# Patient Record
Sex: Male | Born: 1940 | Race: White | Hispanic: No | Marital: Married | Smoking: Former smoker
Health system: Southern US, Community
[De-identification: ages and names within clinical notes are randomized; demographics above are authoritative.]

## PROBLEM LIST (undated history)

## (undated) DIAGNOSIS — Z8739 Personal history of other diseases of the musculoskeletal system and connective tissue: Secondary | ICD-10-CM

## (undated) DIAGNOSIS — I10 Essential (primary) hypertension: Secondary | ICD-10-CM

## (undated) DIAGNOSIS — I252 Old myocardial infarction: Secondary | ICD-10-CM

## (undated) DIAGNOSIS — R0600 Dyspnea, unspecified: Secondary | ICD-10-CM

## (undated) DIAGNOSIS — N189 Chronic kidney disease, unspecified: Secondary | ICD-10-CM

## (undated) DIAGNOSIS — R0609 Other forms of dyspnea: Secondary | ICD-10-CM

## (undated) DIAGNOSIS — I493 Ventricular premature depolarization: Secondary | ICD-10-CM

## (undated) DIAGNOSIS — N529 Male erectile dysfunction, unspecified: Secondary | ICD-10-CM

## (undated) DIAGNOSIS — K219 Gastro-esophageal reflux disease without esophagitis: Secondary | ICD-10-CM

## (undated) DIAGNOSIS — K635 Polyp of colon: Secondary | ICD-10-CM

## (undated) DIAGNOSIS — I209 Angina pectoris, unspecified: Secondary | ICD-10-CM

## (undated) DIAGNOSIS — I2510 Atherosclerotic heart disease of native coronary artery without angina pectoris: Secondary | ICD-10-CM

## (undated) DIAGNOSIS — Z9289 Personal history of other medical treatment: Secondary | ICD-10-CM

## (undated) DIAGNOSIS — E785 Hyperlipidemia, unspecified: Secondary | ICD-10-CM

## (undated) DIAGNOSIS — I509 Heart failure, unspecified: Secondary | ICD-10-CM

## (undated) DIAGNOSIS — M1990 Unspecified osteoarthritis, unspecified site: Secondary | ICD-10-CM

## (undated) DIAGNOSIS — K449 Diaphragmatic hernia without obstruction or gangrene: Secondary | ICD-10-CM

## (undated) HISTORY — DX: Male erectile dysfunction, unspecified: N52.9

## (undated) HISTORY — DX: Old myocardial infarction: I25.2

## (undated) HISTORY — DX: Gastro-esophageal reflux disease without esophagitis: K21.9

## (undated) HISTORY — DX: Polyp of colon: K63.5

## (undated) HISTORY — DX: Hyperlipidemia, unspecified: E78.5

## (undated) HISTORY — PX: HEMORRHOIDECTOMY WITH HEMORRHOID BANDING: SHX5633

## (undated) HISTORY — DX: Atherosclerotic heart disease of native coronary artery without angina pectoris: I25.10

## (undated) HISTORY — DX: Essential (primary) hypertension: I10

## (undated) HISTORY — DX: Diaphragmatic hernia without obstruction or gangrene: K44.9

## (undated) HISTORY — DX: Ventricular premature depolarization: I49.3

## (undated) HISTORY — PX: COLONOSCOPY: SHX174

## (undated) HISTORY — PX: CARDIAC CATHETERIZATION: SHX172

---
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
## 1958-06-26 HISTORY — PX: TONSILLECTOMY AND ADENOIDECTOMY: SUR1326

## 1991-06-27 DIAGNOSIS — I252 Old myocardial infarction: Secondary | ICD-10-CM

## 1991-06-27 HISTORY — PX: CORONARY ARTERY BYPASS GRAFT: SHX141

## 1991-06-27 HISTORY — DX: Old myocardial infarction: I25.2

## 1999-08-01 ENCOUNTER — Encounter: Admission: RE | Admit: 1999-08-01 | Discharge: 1999-10-30 | Payer: Self-pay | Admitting: Orthopaedic Surgery

## 1999-08-17 ENCOUNTER — Ambulatory Visit (HOSPITAL_BASED_OUTPATIENT_CLINIC_OR_DEPARTMENT_OTHER): Admission: RE | Admit: 1999-08-17 | Discharge: 1999-08-17 | Payer: Self-pay | Admitting: Orthopaedic Surgery

## 1999-08-25 HISTORY — PX: INCISION AND DRAINAGE OF WOUND: SHX1803

## 1999-08-25 HISTORY — PX: SHOULDER ARTHROSCOPY W/ ROTATOR CUFF REPAIR: SHX2400

## 1999-08-30 ENCOUNTER — Encounter: Admission: RE | Admit: 1999-08-30 | Discharge: 1999-09-22 | Payer: Self-pay | Admitting: Orthopaedic Surgery

## 2000-01-03 ENCOUNTER — Encounter: Payer: Self-pay | Admitting: Orthopaedic Surgery

## 2000-01-03 ENCOUNTER — Ambulatory Visit (HOSPITAL_COMMUNITY): Admission: RE | Admit: 2000-01-03 | Discharge: 2000-01-03 | Payer: Self-pay | Admitting: Orthopaedic Surgery

## 2000-01-04 ENCOUNTER — Encounter: Admission: RE | Admit: 2000-01-04 | Discharge: 2000-01-04 | Payer: Self-pay | Admitting: Internal Medicine

## 2000-01-06 ENCOUNTER — Encounter (INDEPENDENT_AMBULATORY_CARE_PROVIDER_SITE_OTHER): Payer: Self-pay | Admitting: Specialist

## 2000-01-06 ENCOUNTER — Inpatient Hospital Stay (HOSPITAL_COMMUNITY): Admission: EM | Admit: 2000-01-06 | Discharge: 2000-01-09 | Payer: Self-pay | Admitting: Orthopaedic Surgery

## 2000-01-06 ENCOUNTER — Ambulatory Visit (HOSPITAL_BASED_OUTPATIENT_CLINIC_OR_DEPARTMENT_OTHER): Admission: RE | Admit: 2000-01-06 | Discharge: 2000-01-06 | Payer: Self-pay | Admitting: Orthopaedic Surgery

## 2000-02-07 ENCOUNTER — Encounter: Admission: RE | Admit: 2000-02-07 | Discharge: 2000-02-07 | Payer: Self-pay | Admitting: Internal Medicine

## 2000-02-15 ENCOUNTER — Encounter: Admission: RE | Admit: 2000-02-15 | Discharge: 2000-02-29 | Payer: Self-pay | Admitting: Podiatry

## 2000-02-28 ENCOUNTER — Encounter: Admission: RE | Admit: 2000-02-28 | Discharge: 2000-02-28 | Payer: Self-pay | Admitting: Internal Medicine

## 2000-03-27 ENCOUNTER — Ambulatory Visit (HOSPITAL_COMMUNITY): Admission: RE | Admit: 2000-03-27 | Discharge: 2000-03-27 | Payer: Self-pay | Admitting: Gastroenterology

## 2000-03-27 ENCOUNTER — Encounter (INDEPENDENT_AMBULATORY_CARE_PROVIDER_SITE_OTHER): Payer: Self-pay | Admitting: Specialist

## 2001-02-24 HISTORY — PX: KNEE ARTHROSCOPY: SHX127

## 2001-03-12 ENCOUNTER — Ambulatory Visit (HOSPITAL_BASED_OUTPATIENT_CLINIC_OR_DEPARTMENT_OTHER): Admission: RE | Admit: 2001-03-12 | Discharge: 2001-03-12 | Payer: Self-pay | Admitting: Orthopaedic Surgery

## 2001-06-04 ENCOUNTER — Ambulatory Visit (HOSPITAL_COMMUNITY): Admission: RE | Admit: 2001-06-04 | Discharge: 2001-06-04 | Payer: Self-pay | Admitting: Interventional Cardiology

## 2003-07-13 ENCOUNTER — Encounter: Admission: RE | Admit: 2003-07-13 | Discharge: 2003-08-06 | Payer: Self-pay | Admitting: Podiatry

## 2004-07-27 ENCOUNTER — Encounter: Admission: RE | Admit: 2004-07-27 | Discharge: 2004-07-27 | Payer: Self-pay | Admitting: Orthopaedic Surgery

## 2004-08-08 ENCOUNTER — Encounter: Admission: RE | Admit: 2004-08-08 | Discharge: 2004-08-08 | Payer: Self-pay | Admitting: Orthopaedic Surgery

## 2004-08-15 ENCOUNTER — Encounter: Admission: RE | Admit: 2004-08-15 | Discharge: 2004-08-18 | Payer: Self-pay | Admitting: Family Medicine

## 2004-08-24 HISTORY — PX: LUMBAR DISC SURGERY: SHX700

## 2004-08-25 ENCOUNTER — Inpatient Hospital Stay (HOSPITAL_COMMUNITY): Admission: RE | Admit: 2004-08-25 | Discharge: 2004-08-26 | Payer: Self-pay | Admitting: Neurosurgery

## 2004-09-25 ENCOUNTER — Ambulatory Visit: Payer: Self-pay | Admitting: Pulmonary Disease

## 2004-09-25 ENCOUNTER — Ambulatory Visit: Payer: Self-pay | Admitting: Internal Medicine

## 2004-09-25 ENCOUNTER — Inpatient Hospital Stay (HOSPITAL_COMMUNITY): Admission: EM | Admit: 2004-09-25 | Discharge: 2004-10-08 | Payer: Self-pay | Admitting: Emergency Medicine

## 2004-09-26 ENCOUNTER — Encounter (INDEPENDENT_AMBULATORY_CARE_PROVIDER_SITE_OTHER): Payer: Self-pay | Admitting: Interventional Cardiology

## 2004-10-05 ENCOUNTER — Encounter (INDEPENDENT_AMBULATORY_CARE_PROVIDER_SITE_OTHER): Payer: Self-pay | Admitting: *Deleted

## 2005-12-22 IMAGING — CR DG FOOT COMPLETE 3+V*R*
3 series · 3 of 3 positions shown · non-contrast
Comparison: none

CLINICAL DATA: Pain in the right great toe.
 RIGHT FOOT ? 3 VIEWS - 10/05/04:
 Three views of the right foot show soft tissue fullness along the first MTP articulation.  An accessory ossicle is demonstrated medially.  There is no fracture or erosive changes.  Mild degenerative changes are seen at the first interphalangeal joint.  Negative for radiopaque foreign body or fracture.

[view not recorded (1 of 3)]
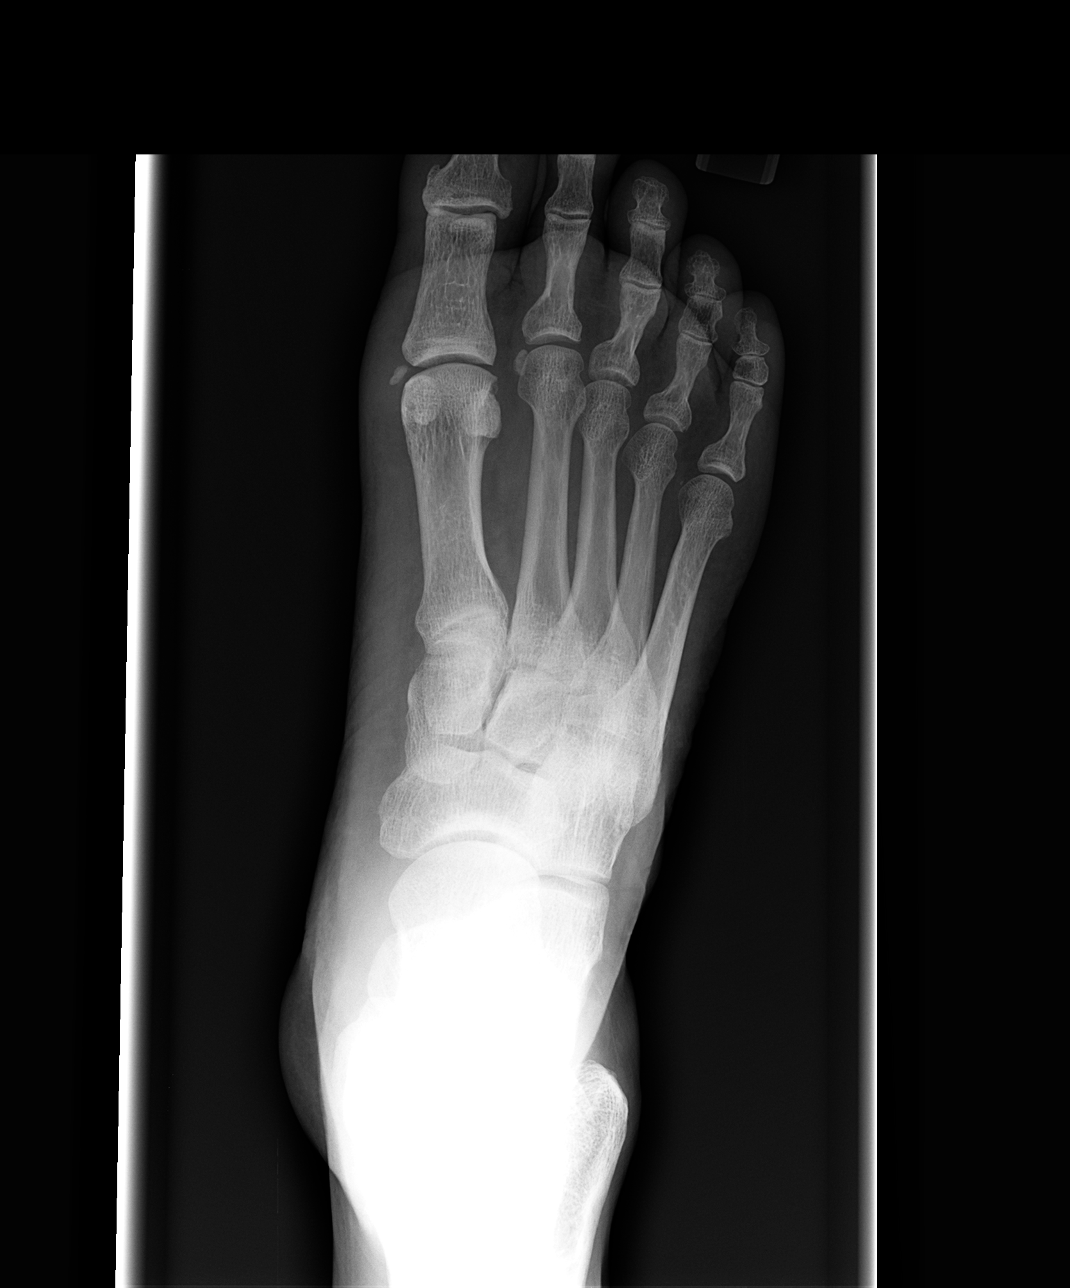

[view not recorded (2 of 3)]
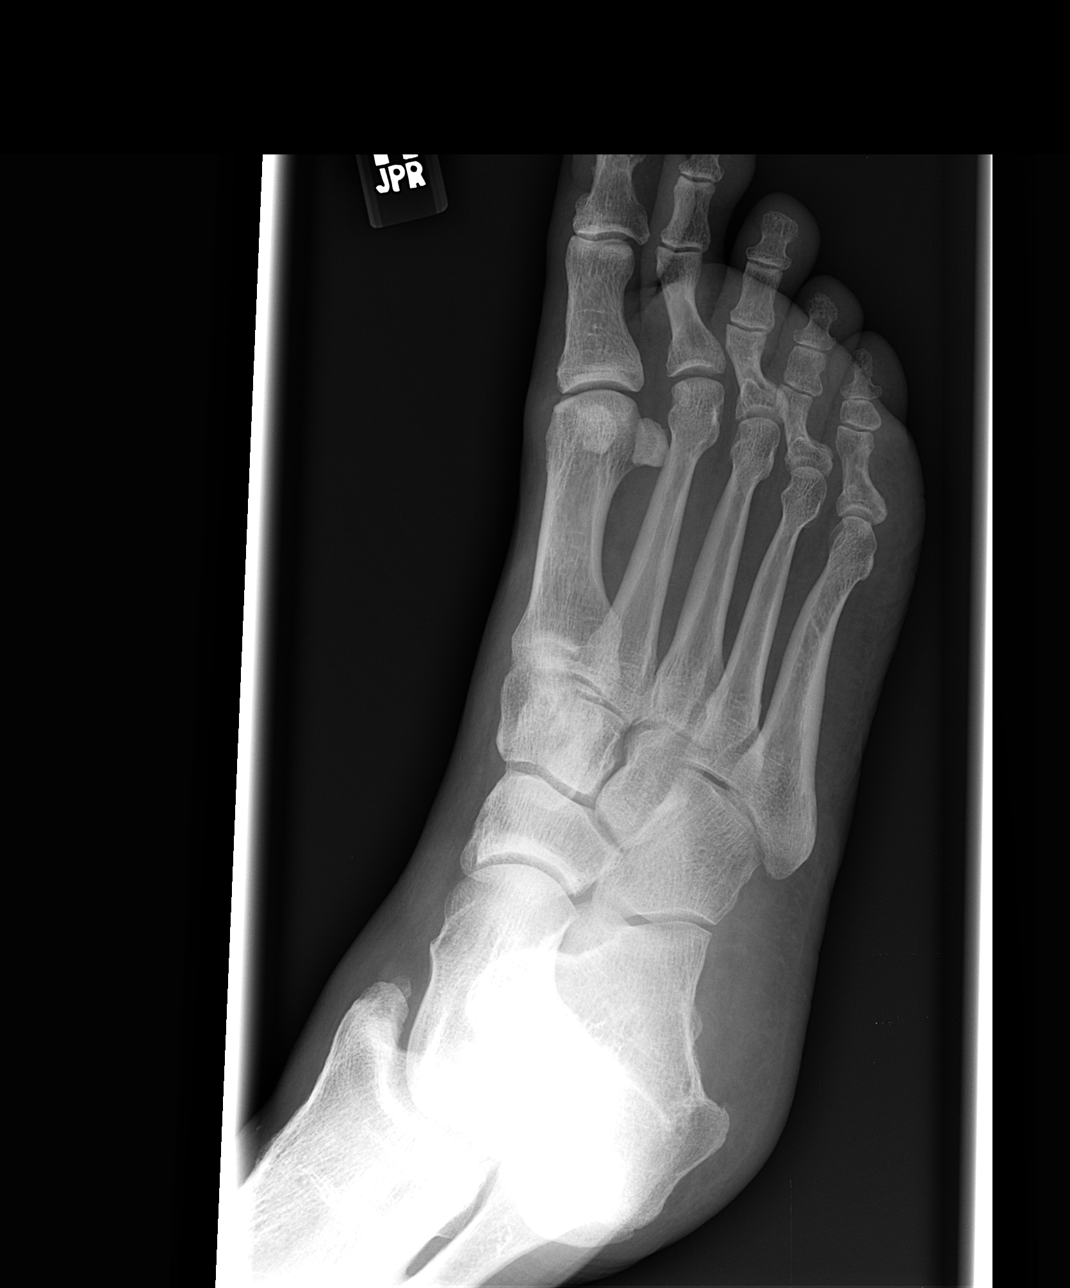

[view not recorded (3 of 3)]
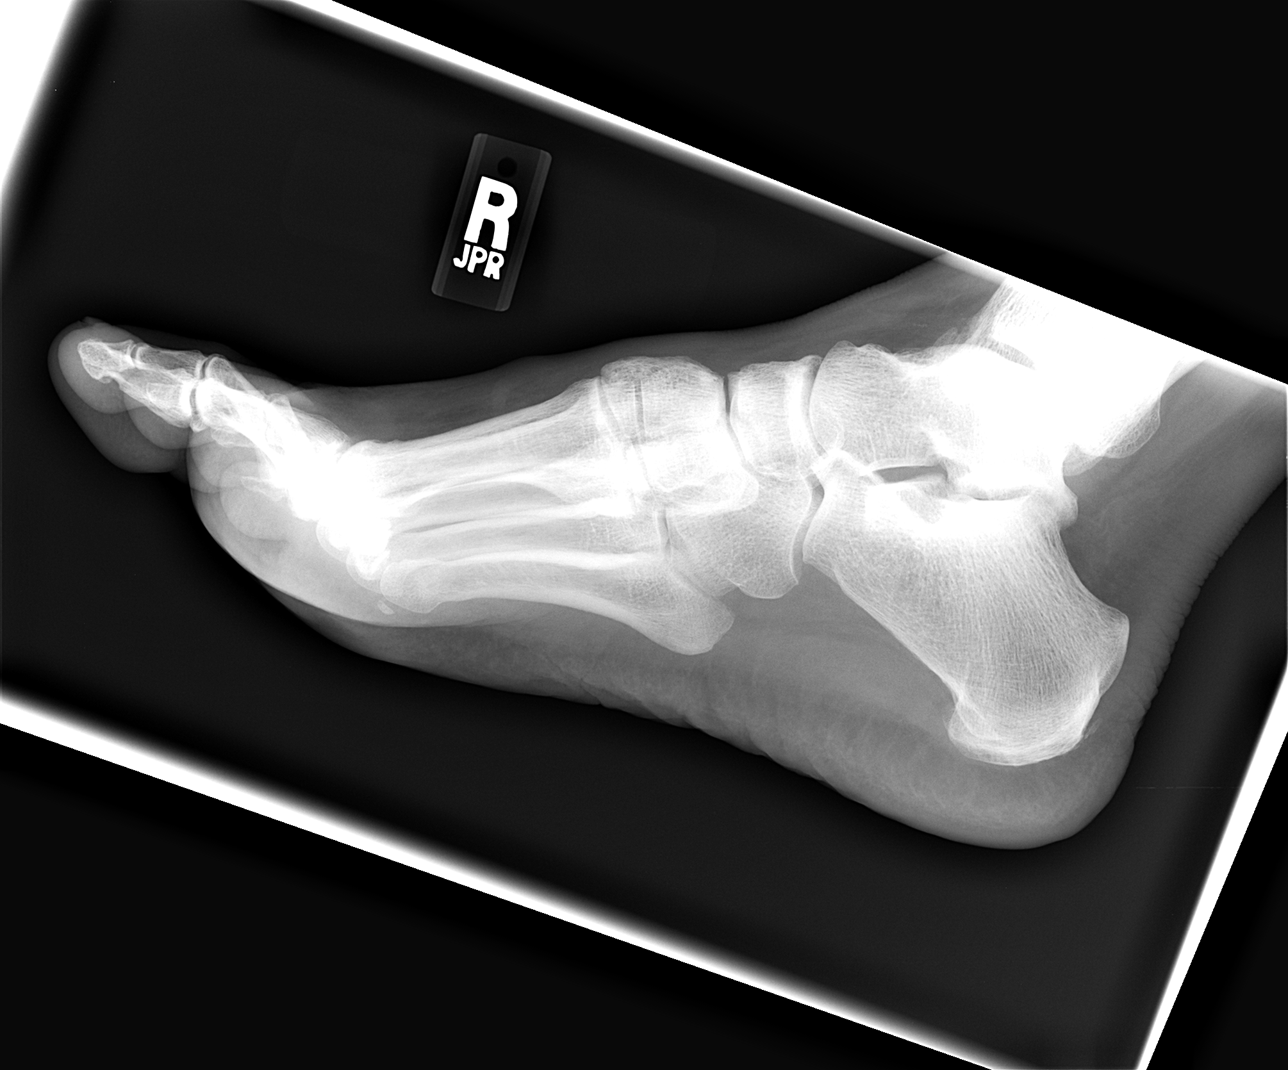

[3 of 3 positions shown; findings below may reference images not displayed]

IMPRESSION: Soft tissue fullness about the first MTP joint.  Negative for fracture or underlying erosion.

## 2007-01-25 ENCOUNTER — Encounter: Admission: RE | Admit: 2007-01-25 | Discharge: 2007-04-25 | Payer: Self-pay | Admitting: Family Medicine

## 2008-06-26 HISTORY — PX: ATRIAL FLUTTER ABLATION: SHX5733

## 2009-02-18 ENCOUNTER — Encounter: Admission: RE | Admit: 2009-02-18 | Discharge: 2009-02-18 | Payer: Self-pay | Admitting: Family Medicine

## 2009-02-18 ENCOUNTER — Encounter: Payer: Self-pay | Admitting: Internal Medicine

## 2009-02-22 ENCOUNTER — Encounter: Payer: Self-pay | Admitting: Internal Medicine

## 2009-03-02 ENCOUNTER — Encounter: Payer: Self-pay | Admitting: Internal Medicine

## 2009-03-29 ENCOUNTER — Encounter: Payer: Self-pay | Admitting: Internal Medicine

## 2009-05-03 ENCOUNTER — Ambulatory Visit: Payer: Self-pay | Admitting: Internal Medicine

## 2009-05-03 ENCOUNTER — Encounter: Payer: Self-pay | Admitting: Internal Medicine

## 2009-05-03 DIAGNOSIS — M1990 Unspecified osteoarthritis, unspecified site: Secondary | ICD-10-CM | POA: Insufficient documentation

## 2009-05-03 DIAGNOSIS — I25810 Atherosclerosis of coronary artery bypass graft(s) without angina pectoris: Secondary | ICD-10-CM | POA: Insufficient documentation

## 2009-05-03 DIAGNOSIS — N189 Chronic kidney disease, unspecified: Secondary | ICD-10-CM | POA: Insufficient documentation

## 2009-05-03 DIAGNOSIS — I10 Essential (primary) hypertension: Secondary | ICD-10-CM | POA: Insufficient documentation

## 2009-05-03 DIAGNOSIS — M109 Gout, unspecified: Secondary | ICD-10-CM | POA: Insufficient documentation

## 2009-05-03 DIAGNOSIS — I5022 Chronic systolic (congestive) heart failure: Secondary | ICD-10-CM | POA: Insufficient documentation

## 2009-05-03 DIAGNOSIS — E785 Hyperlipidemia, unspecified: Secondary | ICD-10-CM | POA: Insufficient documentation

## 2009-05-03 DIAGNOSIS — Z862 Personal history of diseases of the blood and blood-forming organs and certain disorders involving the immune mechanism: Secondary | ICD-10-CM | POA: Insufficient documentation

## 2009-05-07 ENCOUNTER — Telehealth: Payer: Self-pay | Admitting: Internal Medicine

## 2009-05-18 ENCOUNTER — Ambulatory Visit: Payer: Self-pay | Admitting: Internal Medicine

## 2009-05-18 LAB — CONVERTED CEMR LAB
BUN: 23 mg/dL (ref 6–23)
CO2: 32 meq/L (ref 19–32)
Chloride: 108 meq/L (ref 96–112)
Eosinophils Relative: 2.4 % (ref 0.0–5.0)
Glucose, Bld: 90 mg/dL (ref 70–99)
HCT: 40 % (ref 39.0–52.0)
Lymphs Abs: 2.4 10*3/uL (ref 0.7–4.0)
MCV: 98.2 fL (ref 78.0–100.0)
Monocytes Absolute: 0.6 10*3/uL (ref 0.1–1.0)
Platelets: 166 10*3/uL (ref 150.0–400.0)
Potassium: 4.3 meq/L (ref 3.5–5.1)
WBC: 5.9 10*3/uL (ref 4.5–10.5)
aPTT: 28.5 s (ref 21.7–28.8)

## 2009-05-25 ENCOUNTER — Ambulatory Visit: Payer: Self-pay | Admitting: Internal Medicine

## 2009-05-25 ENCOUNTER — Inpatient Hospital Stay (HOSPITAL_COMMUNITY): Admission: RE | Admit: 2009-05-25 | Discharge: 2009-05-29 | Payer: Self-pay | Admitting: Internal Medicine

## 2009-05-26 ENCOUNTER — Encounter: Payer: Self-pay | Admitting: Internal Medicine

## 2009-05-26 HISTORY — PX: INSERT / REPLACE / REMOVE PACEMAKER: SUR710

## 2009-05-31 ENCOUNTER — Telehealth: Payer: Self-pay | Admitting: Internal Medicine

## 2009-06-16 ENCOUNTER — Ambulatory Visit: Payer: Self-pay

## 2009-06-16 ENCOUNTER — Encounter: Payer: Self-pay | Admitting: Internal Medicine

## 2009-07-08 ENCOUNTER — Telehealth (INDEPENDENT_AMBULATORY_CARE_PROVIDER_SITE_OTHER): Payer: Self-pay | Admitting: *Deleted

## 2009-07-08 ENCOUNTER — Encounter: Payer: Self-pay | Admitting: Internal Medicine

## 2009-08-02 ENCOUNTER — Encounter: Payer: Self-pay | Admitting: Internal Medicine

## 2009-09-07 ENCOUNTER — Ambulatory Visit: Payer: Self-pay | Admitting: Internal Medicine

## 2009-12-05 ENCOUNTER — Encounter: Payer: Self-pay | Admitting: Internal Medicine

## 2009-12-06 ENCOUNTER — Ambulatory Visit: Payer: Self-pay | Admitting: Internal Medicine

## 2009-12-23 ENCOUNTER — Encounter: Payer: Self-pay | Admitting: Internal Medicine

## 2009-12-23 ENCOUNTER — Telehealth: Payer: Self-pay | Admitting: Internal Medicine

## 2010-03-02 ENCOUNTER — Encounter: Payer: Self-pay | Admitting: Internal Medicine

## 2010-03-29 ENCOUNTER — Ambulatory Visit: Payer: Self-pay | Admitting: Internal Medicine

## 2010-05-04 ENCOUNTER — Telehealth (INDEPENDENT_AMBULATORY_CARE_PROVIDER_SITE_OTHER): Payer: Self-pay | Admitting: *Deleted

## 2010-05-23 ENCOUNTER — Ambulatory Visit: Payer: Self-pay | Admitting: Internal Medicine

## 2010-06-23 ENCOUNTER — Encounter: Payer: Self-pay | Admitting: Internal Medicine

## 2010-06-23 ENCOUNTER — Ambulatory Visit: Payer: Self-pay | Admitting: Internal Medicine

## 2010-06-29 ENCOUNTER — Encounter: Payer: Self-pay | Admitting: Internal Medicine

## 2010-07-04 ENCOUNTER — Encounter: Payer: Self-pay | Admitting: Internal Medicine

## 2010-07-17 ENCOUNTER — Encounter: Payer: Self-pay | Admitting: Orthopaedic Surgery

## 2010-07-26 NOTE — Letter (Signed)
Summary: Remote Device Check  [facility], Main Office  [address]   [place], [place] [address]   Phone: [phone]  Fax: [phone]     December 23, 2009 MRN: [id number]   [name] [address] [place], [place]  [address]   Dear [name],   Your remote transmission was recieved and reviewed by your physician.  All diagnostics were within normal limits for you.   __X____Your next office visit is scheduled for:  September 2011 with Dr Ladona Ridgel.                            Please call our office to schedule an appointment.    Sincerely,  Vella Kohler

## 2010-07-26 NOTE — Progress Notes (Signed)
Summary: wants results od test  Phone Note Call from Patient   Caller: Patient [phone] Reason for Call: Talk to Nurse Summary of Call: wants results of the test on his difibulator-pls cakk [phone] Initial call taken by: Glynda Jaeger,  December 23, 2009 2:01 PM  Follow-up for Phone Call        Pt notified.  Merlin ok. Gypsy Balsam RN BSN  December 24, 2009 4:19 PM

## 2010-07-26 NOTE — Progress Notes (Signed)
Summary: QUESTION ON DEVICE  Phone Note Call from Patient Call back at Home Phone [phone] Call back at [facility] [phone]   Caller: Patient Reason for Call: Talk to Nurse Summary of Call: QUESTION RE DEVICE Initial call taken by: Roe Coombs,  May 04, 2010 11:07 AM  Follow-up for Phone Call        Spoke with patient, he's concerned he can't feel his device working.  He will send a Merlin transmission when he gets home today.   Follow-up by: Altha Harm, LPN,  May 04, 2010 2:11 PM  Additional Follow-up for Phone Call Additional follow up Details #1::        Merlin transmission normal, patient aware. Additional Follow-up by: Altha Harm, LPN,  May 04, 2010 3:45 PM

## 2010-07-26 NOTE — Cardiovascular Report (Signed)
Summary: Office Visit   Office Visit   Imported By: Roderic Ovens 09/13/2009 15:14:53  _____________________________________________________________________  External Attachment:    Type:   Image     Comment:   External Document

## 2010-07-26 NOTE — Assessment & Plan Note (Signed)
Summary: pc2 [facility]   Primary Provider:  Verdis Prime   History of Present Illness: [name] returns today for followup.  He is a pleasant 70 yo man with a h/o CAD, ICM, CHF, s/p BiV ICD.  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  Post ablation, the tachycardia was no longer inducible and he still had AV conduction albeit at a long AV delay.  The patient returns today for additional followup.  He states that he feels normal and has returned to his usual activity including playing golf.  No syncope, c/p or sob.  He has been well.  Current Medications (verified): 1)  Carvedilol 25 Mg Tabs (Carvedilol) .... Take One Tablet By Mouth Twice A Day 2)  Lisinopril 10 Mg Tabs (Lisinopril) .Marland Kitchen.. 1 Tab Once Daily 3)  Pravachol 80 Mg Tabs (Pravastatin Sodium) .Marland Kitchen.. 1 Tab At Bedtime 4)  Desyrel 50mg  .... 1 Tab Once Daily 5)  Restoril 30 Mg Caps (Temazepam) .Marland Kitchen.. 1 Cap Once Daily 6)  Allopurinol 100 Mg Tabs (Allopurinol) .Marland Kitchen.. 1 Tab Once Daily 7)  Niaspan 1000 Mg Cr-Tabs (Niacin (Antihyperlipidemic)) .Marland Kitchen.. 1 Tab Once Daily 8)  Niaspan 500 Mg Cr-Tabs (Niacin (Antihyperlipidemic)) .... Take One Tablet By Mouth Once Daily. 9)  Lovaza 1 Gm Caps (Omega-3-Acid Ethyl Esters) .Marland Kitchen.. 1 Cap Once Daily 10)  Multivitamins   Tabs (Multiple Vitamin) .Marland Kitchen.. 1 Tab Once Daily 11)  Vitamin D 1000 Unit Tabs (Cholecalciferol) .Marland Kitchen.. 1 Tab Once Daily 12)  Aspirin Ec 325 Mg Tbec (Aspirin) .... Take One Tablet By Mouth Daily 13)  Stool Softener 100 Mg Caps (Docusate Sodium) .... As Needed 14)  Zetia 10 Mg Tabs (Ezetimibe) .... Take One-Half  Tablet By Mouth Every Other Day  Allergies (verified): 1)  ! * Lyrica 2)  ! Ibuprofen  Past History:  Past Medical History: Last updated: 09/07/2009 Current Problems:  PREMATURE VENTRICULAR CONTRACTIONS (ICD-427.69) CAD, ARTERY BYPASS GRAFT (ICD-414.04) CHF  (ICD-428.0) UNSPECIFIED ESSENTIAL HYPERTENSION (ICD-401.9) HYPERLIPIDEMIA (ICD-272.4) CHRONIC KIDNEY DISEASE UNSPECIFIED (ICD-585.9) GOUT, UNSPECIFIED (ICD-274.9) DEGENERATIVE JOINT DISEASE (ICD-715.90) ANEMIA, IRON DEFICIENCY, HX OF (ICD-V12.3)    Past Surgical History: Last updated: 05/03/2009  1.  Lumbar surgery in March of 2006.  2.  Coronary artery bypass or CABG in 1993.  3.  Right shoulder surgery in 1991.  4.  Left knee surgery.  Review of Systems  The patient denies chest pain, syncope, dyspnea on exertion, and peripheral edema.    Vital Signs:  Patient profile:   70 year old male Height:      70 inches Weight:      190 pounds BMI:     27.36 Pulse rate:   77 / minute BP sitting:   132 / 80  (left arm)  Vitals Entered By: Laurance Flatten CMA (March 29, 2010 11:36 AM)  Physical Exam  General:  Pleasant middle aged man NAD. Head:  normocephalic and atraumatic Eyes:  PERRLA/EOM intact; conjunctiva and lids normal. Mouth:  Teeth, gums and palate normal. Oral mucosa normal. Neck:  JVD 7 cm. Chest Wall:  Well healed ICD incision. Lungs:  Rales in the bases bilaterally.  No wheezes or rhonchi.  No increased work of breathing. Heart:  Regular RR with normal S1 and S2.  PMI is enlarged and laterally displaced.  A soft systolic murmur was present. Abdomen:  Bowel sounds positive; abdomen soft and non-tender without masses, organomegaly, or hernias  noted. No hepatosplenomegaly. Msk:  Back normal, normal gait. Muscle strength and tone normal. Pulses:  pulses normal in all 4 extremities Extremities:  No clubbing or cyanosis. No edema. Neurologic:  Alert and oriented x 3.    ICD Specifications Following MD:  Lewayne Bunting, MD     Referring MD:  [facility] ICD Vendor:  [facility]     ICD Model Number:  [phone]     ICD Serial Number:  [id number] ICD DOI:  05/25/2009     ICD Implanting MD:  Lewayne Bunting, MD  Lead 1:    Location: RA     DOI: 05/25/2009     Model #: [id number]     Serial #:  [id number]     Status: active Lead 2:    Location: RV     DOI: 05/25/2009     Model #: [id number]     Serial #: [id number]     Status: active Lead 3:    Location: LV     DOI: 05/25/2009     Model #: [id number]     Serial #: [id number]     Status: active  Indications::  ICM, CHF   ICD Follow Up Remote Check?  No Charge Time:  9.0 seconds     Battery Est. Longevity:  5.3 years Underlying rhythm:  SR with PVC's ICD Dependent:  No       ICD Device Measurements Atrium:  Amplitude: 1.3 mV, Impedance: 410 ohms, Threshold: 0.5 V at 0.5 msec Right Ventricle:  Amplitude: 9.2 mV, Impedance: 410 ohms, Threshold: 0.75 V at 0.5 msec Left Ventricle:  Impedance: 1025 ohms, Threshold: 1.625 V at 1.0 msec Configuration: BIPOLAR Shock Impedance: 43 ohms   Episodes MS Episodes:  1     Percent Mode Switch:  <1%     Coumadin:  No Shock:  0     ATP:  0     Nonsustained:  0     Atrial Pacing:  9.1%     Ventricular Pacing:  77%  Brady Parameters Mode DDD     Lower Rate Limit:  60     Upper Rate Limit 120 PAV 140     Sensed AV Delay:  90  Tachy Zones VF:  222     VT:  184     Next Remote Date:  06/30/2010     Next Cardiology Appt Due:  03/27/2011 Tech Comments:  Quick opt done.  BiV pacing only 77%, AV delays reprogrammed as above.  1 mode switch 10 seconds, -coumadin.   17% PVC's.  Merlin transmissions every 3 months.  ROV 1 year with Dr. Ladona Ridgel. Altha Harm, LPN  March 29, 2010 11:48 AM  MD Comments:  Agree with above.  Impression & Recommendations:  Problem # 1:  AUTOMATIC IMPLANTABLE CARDIAC DEFIBRILLATOR SITU (ICD-V45.02) His device is working normally.  Will recheck in several months.  Problem # 2:  PREMATURE VENTRICULAR CONTRACTIONS (ICD-427.69) These remain  but appear to be asymptomatic. His updated medication list for this problem includes:    Carvedilol 25 Mg Tabs (Carvedilol) .Marland Kitchen... Take one tablet by mouth twice a day    Lisinopril 10 Mg Tabs (Lisinopril) .Marland Kitchen... 1 tab once daily    Aspirin Ec 325  Mg Tbec (Aspirin) .Marland Kitchen... Take one tablet by mouth daily  Problem # 3:  CHF (ICD-428.0) His chronic systolic CHF is well controlled. Continue meds as below. His updated medication list for this problem includes:    Carvedilol 25 Mg Tabs (Carvedilol) .Marland KitchenMarland KitchenMarland KitchenMarland Kitchen  Take one tablet by mouth twice a day    Lisinopril 10 Mg Tabs (Lisinopril) .Marland Kitchen... 1 tab once daily    Aspirin Ec 325 Mg Tbec (Aspirin) .Marland Kitchen... Take one tablet by mouth daily  Patient Instructions: 1)  Your physician wants you to follow-up in:  12 months with Dr Court Joy will receive a reminder letter in the mail two months in advance. If you don't receive a letter, please call our office to schedule the follow-up appointment. 2)  Transmission 06/30/2010

## 2010-07-26 NOTE — Progress Notes (Signed)
  Phone Note From Other Clinic   Caller: Scarlette Calico Details for Reason: Pt. Information Initial call taken by: Denny Peon    Faxed all Recent Cardiac Info over to Dr.Moore office to fax [phone] [facility]  July 08, 2009 10:07 AM

## 2010-07-26 NOTE — Assessment & Plan Note (Signed)
Summary: PC2/ST JUDE/JML   Visit Type:  Follow-up Primary Provider:  Verdis Prime   History of Present Illness: [name] returns today for followup.  He is a pleasant 70 yo man with a h/o CAD, ICM, CHF, s/p BiV ICD.  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  Post ablation, the tachycardia was no longer inducible and he still had AV conduction albeit at a long AV delay.  The patient returns today for additional followup.  He states that he feels normal and has returned to his usual activity including playing golf.  No syncope, c/p or sob.  Current Medications (verified): 1)  Carvedilol 25 Mg Tabs (Carvedilol) .... Take One Tablet By Mouth Twice A Day 2)  Lisinopril 10 Mg Tabs (Lisinopril) .Marland Kitchen.. 1 Tab Once Daily 3)  Pravachol 80 Mg Tabs (Pravastatin Sodium) .Marland Kitchen.. 1 Tab At Bedtime 4)  Desyrel 50mg  .... 1 Tab Once Daily 5)  Restoril 30 Mg Caps (Temazepam) .Marland Kitchen.. 1 Cap Once Daily 6)  Allopurinol 100 Mg Tabs (Allopurinol) .Marland Kitchen.. 1 Tab Once Daily 7)  Niaspan 1000 Mg Cr-Tabs (Niacin (Antihyperlipidemic)) .Marland Kitchen.. 1 Tab Once Daily 8)  Niaspan 500 Mg Cr-Tabs (Niacin (Antihyperlipidemic)) .... Take One Tablet By Mouth Once Daily. 9)  Lovaza 1 Gm Caps (Omega-3-Acid Ethyl Esters) .Marland Kitchen.. 1 Cap Once Daily 10)  Multivitamins   Tabs (Multiple Vitamin) .Marland Kitchen.. 1 Tab Once Daily 11)  Vitamin D 1000 Unit Tabs (Cholecalciferol) .Marland Kitchen.. 1 Tab Once Daily 12)  Aspirin Ec 325 Mg Tbec (Aspirin) .... Take One Tablet By Mouth Daily 13)  Stool Softener 100 Mg Caps (Docusate Sodium) .... As Needed  Allergies: 1)  ! * Lyrica 2)  ! Ibuprofen  Past History:  Past Surgical History: Last updated: 05/03/2009  1.  Lumbar surgery in March of 2006.  2.  Coronary artery bypass or CABG in 1993.  3.  Right shoulder surgery in 1991.  4.  Left knee surgery.  Past Medical History: Current Problems:    PREMATURE VENTRICULAR CONTRACTIONS (ICD-427.69) CAD, ARTERY BYPASS GRAFT (ICD-414.04) CHF (ICD-428.0) UNSPECIFIED ESSENTIAL HYPERTENSION (ICD-401.9) HYPERLIPIDEMIA (ICD-272.4) CHRONIC KIDNEY DISEASE UNSPECIFIED (ICD-585.9) GOUT, UNSPECIFIED (ICD-274.9) DEGENERATIVE JOINT DISEASE (ICD-715.90) ANEMIA, IRON DEFICIENCY, HX OF (ICD-V12.3)    Review of Systems  The patient denies chest pain, syncope, dyspnea on exertion, and peripheral edema.    Vital Signs:  Patient profile:   70 year old male Height:      70 inches Weight:      186 pounds BMI:     26.78 Pulse rate:   59 / minute BP sitting:   130 / 82  (left arm)  Vitals Entered By: Laurance Flatten CMA (September 07, 2009 11:19 AM)  Physical Exam  General:  Pleasant middle aged man NAD. Head:  normocephalic and atraumatic Eyes:  PERRLA/EOM intact; conjunctiva and lids normal. Mouth:  Teeth, gums and palate normal. Oral mucosa normal. Neck:  JVD 7 cm. Chest Wall:  Well healed ICD incision. Lungs:  Rales in the bases bilaterally.  No wheezes or rhonchi.  No increased work of breathing. Heart:  Regular RR with normal S1 and S2.  PMI is enlarged and laterally displaced.  A soft systolic murmur was present. Abdomen:  Bowel sounds positive; abdomen soft and non-tender without masses, organomegaly, or hernias noted. No hepatosplenomegaly. Msk:  Back normal, normal gait. Muscle strength and tone normal. Pulses:  pulses normal in all 4 extremities Extremities:  No clubbing or cyanosis. No edema. Neurologic:  Alert and oriented x 3.    ICD Specifications Following MD:  Lewayne Bunting, MD     Referring MD:  [facility] ICD Vendor:  [facility]     ICD Model Number:  [id number]     ICD Serial Number:  [id number] ICD DOI:  05/25/2009     ICD Implanting MD:  Lewayne Bunting, MD  Lead 1:    Location: RA     DOI: 05/25/2009     Model #: [id number]     Serial #: [id number]     Status: active Lead 2:    Location: RV     DOI: 05/25/2009     Model #: [id number]     Serial #:  [id number]     Status: active Lead 3:    Location: LV     DOI: 05/25/2009     Model #: 1258T     Serial #: [id number]     Status: active  Indications::  ICM, CHF   ICD Follow Up Remote Check?  No Battery Voltage:  93% V     Charge Time:  8.1 seconds     Battery Est. Longevity:  6.7 YEARS Underlying rhythm:  SR WITH PVC'S ICD Dependent:  No       ICD Device Measurements Atrium:  Amplitude: 1.6 mV, Impedance: 460 ohms, Threshold: 0.75 V at 0.5 msec Right Ventricle:  Amplitude: 11.3 mV, Impedance: 440 ohms, Threshold: 0.75 V at 0.5 msec Left Ventricle:  Impedance: 1025 ohms, Threshold: 1.625 V at 0.4 msec Configuration: BIPOLAR Shock Impedance: 43 ohms   Episodes MS Episodes:  5     Percent Mode Switch:  <1%     Coumadin:  No Shock:  0     ATP:  0     Nonsustained:  0     Atrial Pacing:  5.1%     Ventricular Pacing:  75%  Brady Parameters Mode DDD     Lower Rate Limit:  60     Upper Rate Limit 120 PAV 160     Sensed AV Delay:  130  Tachy Zones VF:  222     VT:  184     Next Remote Date:  12/06/2009     Next Cardiology Appt Due:  05/26/2010 Tech Comments:  Normal device function.  RA and RV outputs decreased to chronic outputs.  LV autocapture on.  All mode switch episodes less than 1 minute.  Pt with 18%PVC's resulting in Bi-V pacing of 75%.  No other changes made today.  Pt will start Merlin transmissions.  ROV 9 months GT. Gypsy Balsam RN BSN  September 07, 2009 11:22 AM  MD Comments:  Agree with above.  Impression & Recommendations:  Problem # 1:  AUTOMATIC IMPLANTABLE CARDIAC DEFIBRILLATOR SITU (ICD-V45.02) His device is working normally.  He has increased ventricular ectopy but does not seem to be symptomatic.  Problem # 2:  CAD, ARTERY BYPASS GRAFT (ICD-414.04) He denies anginal symptoms. Continue current meds. His updated medication list for this problem includes:    Carvedilol 25 Mg Tabs (Carvedilol) .Marland Kitchen... Take one tablet by mouth twice a day    Lisinopril 10 Mg Tabs  (Lisinopril) .Marland Kitchen... 1 tab once daily    Aspirin Ec 325 Mg Tbec (Aspirin) .Marland Kitchen... Take one tablet by mouth daily  Problem # 3:  CHF (ICD-428.0) He appears to be class 1-2.  A low sodium diet and continued medical therapy is recommended. His updated medication  list for this problem includes:    Carvedilol 25 Mg Tabs (Carvedilol) .Marland Kitchen... Take one tablet by mouth twice a day    Lisinopril 10 Mg Tabs (Lisinopril) .Marland Kitchen... 1 tab once daily    Aspirin Ec 325 Mg Tbec (Aspirin) .Marland Kitchen... Take one tablet by mouth daily

## 2010-07-26 NOTE — Procedures (Signed)
Summary: icd check/[facility]   Current Medications (verified): 1)  Carvedilol 25 Mg Tabs (Carvedilol) .... Take One Tablet By Mouth Twice A Day 2)  Lisinopril 10 Mg Tabs (Lisinopril) .Marland Kitchen.. 1 Tab Once Daily 3)  Pravachol 80 Mg Tabs (Pravastatin Sodium) .Marland Kitchen.. 1 Tab At Bedtime 4)  Desyrel 50mg  .... 1 Tab Once Daily 5)  Restoril 30 Mg Caps (Temazepam) .Marland Kitchen.. 1 Cap Once Daily 6)  Allopurinol 100 Mg Tabs (Allopurinol) .Marland Kitchen.. 1 Tab Once Daily 7)  Niaspan 1000 Mg Cr-Tabs (Niacin (Antihyperlipidemic)) .Marland Kitchen.. 1 Tab Once Daily 8)  Niaspan 500 Mg Cr-Tabs (Niacin (Antihyperlipidemic)) .... Take One Tablet By Mouth Once Daily. 9)  Lovaza 1 Gm Caps (Omega-3-Acid Ethyl Esters) .Marland Kitchen.. 1 Cap Once Daily 10)  Multivitamins   Tabs (Multiple Vitamin) .Marland Kitchen.. 1 Tab Once Daily 11)  Vitamin D 1000 Unit Tabs (Cholecalciferol) .Marland Kitchen.. 1 Tab Once Daily 12)  Aspirin Ec 325 Mg Tbec (Aspirin) .... Take One Tablet By Mouth Daily 13)  Stool Softener 100 Mg Caps (Docusate Sodium) .... As Needed 14)  Zetia 10 Mg Tabs (Ezetimibe) .... Take One-Half  Tablet By Mouth Every Other Day  Allergies (verified): 1)  ! * Lyrica 2)  ! Ibuprofen   ICD Specifications Following MD:  Lewayne Bunting, MD     Referring MD:  [facility] ICD Vendor:  [employer]     ICD Model Number:  [phone]     ICD Serial Number:  [id number] ICD DOI:  05/25/2009     ICD Implanting MD:  Lewayne Bunting, MD  Lead 1:    Location: RA     DOI: 05/25/2009     Model #: [id number]     Serial #: [id number]     Status: active Lead 2:    Location: RV     DOI: 05/25/2009     Model #: [id number]     Serial #: [id number]     Status: active Lead 3:    Location: LV     DOI: 05/25/2009     Model #: 1258T     Serial #: [id number]     Status: active  Indications::  ICM, CHF   ICD Follow Up Remote Check?  No Charge Time:  9.0 seconds     Battery Est. Longevity:  3.2 years ICD Dependent:  No       ICD Device Measurements Atrium:  Amplitude: 1.8 mV, Impedance: 440 ohms,  Right Ventricle:  Amplitude: 11.3 mV,  Impedance: 440 ohms,  Left Ventricle:  Impedance: 690 ohms, Threshold: 4.0 V at 1.5 msec Configuration: BIPOLAR  Episodes Coumadin:  No Shock:  0     ATP:  0     Nonsustained:  0     Ventricular Pacing:  85%  Brady Parameters Mode DDD     Lower Rate Limit:  60     Upper Rate Limit 120 PAV 140     Sensed AV Delay:  90  Tachy Zones VF:  222     VT:  184     Tech Comments:  Mr. Domagalski was seen today because of a Merliln alert for high LV threshold.  His threshold today was 4.0@1 .5 and impedance 690.  At. last check in October it was 1.625@1 .0  and 1025.  Per Dr. Ladona Ridgel I have reprogrammed his outputs to 5.0@1 .5 and he will see him back in one months time.   Altha Harm, LPN  May 23, 2010 9:25 AM

## 2010-07-26 NOTE — Cardiovascular Report (Signed)
Summary: Office Visit Remote   Office Visit Remote   Imported By: Roderic Ovens 12/24/2009 16:34:37  _____________________________________________________________________  External Attachment:    Type:   Image     Comment:   External Document

## 2010-07-26 NOTE — Cardiovascular Report (Signed)
Summary: Office Visit  Office Visit   Imported By: Marylou Mccoy 04/05/2010 11:58:25  _____________________________________________________________________  External Attachment:    Type:   Image     Comment:   External Document

## 2010-07-26 NOTE — Letter (Signed)
Summary: [facility] Office Note  [facility] Office Note   Imported By: Roderic Ovens 09/15/2009 13:34:36  _____________________________________________________________________  External Attachment:    Type:   Image     Comment:   External Document

## 2010-07-28 NOTE — Assessment & Plan Note (Signed)
Summary: device/saf   Visit Type:  Follow-up Primary [name]:  [name]   History of Present Illness: [name] returns today for followup.  He is a pleasant 70 yo man with a h/o CAD, ICM, CHF, s/p BiV ICD.  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  Post ablation, the tachycardia was no longer inducible and he still had AV conduction albeit at a long AV delay.  The patient returns today for additional followup. He was recently noted to have an elevation of his pacing threshold and this has worsened today.  He thinks that his energy has not been as good the past few weeks (capturing from the RV alone).  He has had no intercurrent ICD shocks and he denies trauma or other problems.  Current Medications (verified): 1)  Carvedilol 25 Mg Tabs (Carvedilol) .... Take One Tablet By Mouth Twice A Day 2)  Lisinopril 10 Mg Tabs (Lisinopril) .Marland Kitchen.. 1 Tab Once Daily 3)  Pravachol 80 Mg Tabs (Pravastatin Sodium) .Marland Kitchen.. 1 Tab At Bedtime 4)  Desyrel 50mg  .... 1 Tab Once Daily 5)  Restoril 30 Mg Caps (Temazepam) .Marland Kitchen.. 1 Cap Once Daily 6)  Allopurinol 100 Mg Tabs (Allopurinol) .Marland Kitchen.. 1 Tab Once Daily 7)  Niaspan 1000 Mg Cr-Tabs (Niacin (Antihyperlipidemic)) .Marland Kitchen.. 1 Tab Once Daily 8)  Niaspan 500 Mg Cr-Tabs (Niacin (Antihyperlipidemic)) .... Take One Tablet By Mouth Once Daily. 9)  Lovaza 1 Gm Caps (Omega-3-Acid Ethyl Esters) .Marland Kitchen.. 1 Cap Once Daily 10)  Multivitamins   Tabs (Multiple Vitamin) .Marland Kitchen.. 1 Tab Once Daily 11)  Vitamin D 1000 Unit Tabs (Cholecalciferol) .Marland Kitchen.. 1 Tab Once Daily 12)  Aspirin Ec 325 Mg Tbec (Aspirin) .... Take One Tablet By Mouth Daily 13)  Stool Softener 100 Mg Caps (Docusate Sodium) .... As Needed  Allergies: 1)  ! * Lyrica 2)  ! Ibuprofen  Past History:  Past Medical History: Last updated: 09/07/2009 Current Problems:  PREMATURE VENTRICULAR CONTRACTIONS  (ICD-427.69) CAD, ARTERY BYPASS GRAFT (ICD-414.04) CHF (ICD-428.0) UNSPECIFIED ESSENTIAL HYPERTENSION (ICD-401.9) HYPERLIPIDEMIA (ICD-272.4) CHRONIC KIDNEY DISEASE UNSPECIFIED (ICD-585.9) GOUT, UNSPECIFIED (ICD-274.9) DEGENERATIVE JOINT DISEASE (ICD-715.90) ANEMIA, IRON DEFICIENCY, HX OF (ICD-V12.3)    Past Surgical History: Last updated: 05/03/2009  1.  Lumbar surgery in March of 2006.  2.  Coronary artery bypass or CABG in 1993.  3.  Right shoulder surgery in 1991.  4.  Left knee surgery.  Review of Systems       The patient complains of dyspnea on exertion.  The patient denies chest pain, syncope, and peripheral edema.    Vital Signs:  Patient profile:   70 year old male Height:      70 inches Weight:      193 pounds BMI:     27.79 Pulse rate:   76 / minute BP sitting:   118 / 70  (left arm)  Vitals Entered By: Laurance Flatten CMA (June 23, 2010 4:06 PM)  Physical Exam  General:  Pleasant middle aged man NAD. Head:  normocephalic and atraumatic Eyes:  PERRLA/EOM intact; conjunctiva and lids normal. Mouth:  Teeth, gums and palate normal. Oral mucosa normal. Neck:  JVD 7 cm. Chest Wall:  Well healed ICD incision. Lungs:  Rales in the bases bilaterally.  No wheezes or rhonchi.  No increased work of breathing. Heart:  Regular RR with normal S1 and S2.  PMI is enlarged and laterally displaced.  A soft systolic  murmur was present. Abdomen:  Bowel sounds positive; abdomen soft and non-tender without masses, organomegaly, or hernias noted. No hepatosplenomegaly. Msk:  Back normal, normal gait. Muscle strength and tone normal. Pulses:  pulses normal in all 4 extremities Extremities:  No clubbing or cyanosis. No edema. Neurologic:  Alert and oriented x 3.   EKG  Procedure date:  06/23/2010  Findings:      Normal sinus rhythm with rate of:  73. Right bundle branch block.     ICD Specifications Following MD:  Lewayne Bunting, MD     Referring MD:  [facility] ICD Vendor:  [facility]  Jude     ICD Model Number:  [phone]     ICD Serial Number:  [id number] ICD DOI:  05/25/2009     ICD Implanting MD:  Lewayne Bunting, MD  Lead 1:    Location: RA     DOI: 05/25/2009     Model #: [id number]     Serial #: [id number]     Status: active Lead 2:    Location: RV     DOI: 05/25/2009     Model #: [id number]     Serial #: [id number]     Status: active Lead 3:    Location: LV     DOI: 05/25/2009     Model #: [id number]     Serial #: [id number]     Status: active  Indications::  ICM, CHF   ICD Follow Up Battery Voltage:  82% V     Charge Time:  9.4 seconds     Battery Est. Longevity:  2.2-3.3 yrs Underlying rhythm:  SR ICD Dependent:  No       ICD Device Measurements Atrium:  Amplitude: 2.0 mV, Impedance: 440 ohms,  Right Ventricle:  Amplitude: 11.3 mV, Impedance: 440 ohms, Threshold: 0.75 V at 0.5 msec Left Ventricle:  Impedance: 260 ohms, Threshold: 7.0 V at 1.5 msec Configuration: BIPOLAR Shock Impedance: 42 ohms   Episodes MS Episodes:  0     Percent Mode Switch:  0     Coumadin:  No Shock:  0     ATP:  0     Nonsustained:  0     Atrial Pacing:  9.5     Ventricular Pacing:  83%  Brady Parameters Mode VVI     Lower Rate Limit:  40     Upper Rate Limit 120 PAV 140     Sensed AV Delay:  90  Tachy Zones VF:  222     VT:  184     Tech Comments:  PT IN FOR FOLLOWUP FOR LV LEAD.  LV THRESHOLD 7.0 @ 1.83ms.  PER GT MODE SWITCHED TO VVI W/LRL 40 bpm. PLAN PER GT.  Vella Kohler  June 23, 2010 4:42 PM MD Comments:  Agree with above.  Impression & Recommendations:  Problem # 1:  AUTOMATIC IMPLANTABLE CARDIAC DEFIBRILLATOR SITU (ICD-V45.02) Today his LV lead is no longer capturing.  I have asked him to get a CXR and we have reprogrammed him to the VVI mode.  I have asked him to continue his usual activity and we will consider whether to proceed with LV lead revsion based on how he feels over the next few weeks and what his CXR looks like.  Problem # 2:  CHF (ICD-428.0) His chronic systolic heart  failure appears to be class 2.  Will follow. His updated medication list for this problem includes:    Carvedilol 25 Mg Tabs (Carvedilol) .Marland Kitchen... Take  one tablet by mouth twice a day    Lisinopril 10 Mg Tabs (Lisinopril) .Marland Kitchen... 1 tab once daily    Aspirin Ec 325 Mg Tbec (Aspirin) .Marland Kitchen... Take one tablet by mouth daily  Problem # 3:  CAD, ARTERY BYPASS GRAFT (ICD-414.04) He denies anginal symptoms. His updated medication list for this problem includes:    Carvedilol 25 Mg Tabs (Carvedilol) .Marland Kitchen... Take one tablet by mouth twice a day    Lisinopril 10 Mg Tabs (Lisinopril) .Marland Kitchen... 1 tab once daily    Aspirin Ec 325 Mg Tbec (Aspirin) .Marland Kitchen... Take one tablet by mouth daily  Patient Instructions: 1)  Your physician recommends that you schedule a follow-up appointment in: 2 months with Dr Ladona Ridgel 2)  Cancel Merlin for  transmission 06/2010

## 2010-07-28 NOTE — Cardiovascular Report (Signed)
Summary: Office Visit   Office Visit   Imported By: Roderic Ovens 06/24/2010 16:19:13  _____________________________________________________________________  External Attachment:    Type:   Image     Comment:   External Document

## 2010-07-29 NOTE — Letter (Signed)
Summary: [name] note  [facility]   Imported By: Kassie Mends 07/15/2009 14:46:46  _____________________________________________________________________  External Attachment:    Type:   Image     Comment:   External Document

## 2010-08-24 ENCOUNTER — Encounter (INDEPENDENT_AMBULATORY_CARE_PROVIDER_SITE_OTHER): Payer: Medicare Other | Admitting: Internal Medicine

## 2010-08-24 ENCOUNTER — Encounter: Payer: Self-pay | Admitting: Internal Medicine

## 2010-08-24 DIAGNOSIS — I2589 Other forms of chronic ischemic heart disease: Secondary | ICD-10-CM

## 2010-08-24 DIAGNOSIS — I5022 Chronic systolic (congestive) heart failure: Secondary | ICD-10-CM

## 2010-09-01 NOTE — Assessment & Plan Note (Signed)
Summary: pc2   Primary Provider:  Verdis Prime   History of Present Illness: [name] returns today for followup.  He is a pleasant 70 yo man with a h/o CAD, ICM, CHF, s/p BiV ICD.  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  Post ablation, the tachycardia was no longer inducible and he still had AV conduction albeit at a long AV delay.  The patient returns today for additional followup. Several months ago his intermittent diaphragmatic stimulation stopped and interogation of his LV lead demonstrated no capture. Subsequent CXR demonstrated drawback of his LV lead.  He has had no intercurrent ICD shocks. He notes that his energy level may be reduced but he is able to play golf and carry out his activities of daily living.  Current Medications (verified): 1)  Carvedilol 25 Mg Tabs (Carvedilol) .... Take One Tablet By Mouth Twice A Day 2)  Lisinopril 10 Mg Tabs (Lisinopril) .Marland Kitchen.. 1 Tab Once Daily 3)  Pravachol 80 Mg Tabs (Pravastatin Sodium) .Marland Kitchen.. 1 Tab At Bedtime 4)  Desyrel 50mg  .... 1 Tab Once Daily 5)  Restoril 30 Mg Caps (Temazepam) .Marland Kitchen.. 1 Cap Once Daily 6)  Allopurinol 100 Mg Tabs (Allopurinol) .Marland Kitchen.. 1 Tab Once Daily 7)  Niaspan 1000 Mg Cr-Tabs (Niacin (Antihyperlipidemic)) .Marland Kitchen.. 1 Tab Once Daily 8)  Niaspan 500 Mg Cr-Tabs (Niacin (Antihyperlipidemic)) .... Take One Tablet By Mouth Once Daily. 9)  Lovaza 1 Gm Caps (Omega-3-Acid Ethyl Esters) .Marland Kitchen.. 1 Cap Once Daily 10)  Multivitamins   Tabs (Multiple Vitamin) .Marland Kitchen.. 1 Tab Once Daily 11)  Vitamin D 1000 Unit Tabs (Cholecalciferol) .Marland Kitchen.. 1 Tab Once Daily 12)  Aspirin Ec 325 Mg Tbec (Aspirin) .... Take One Tablet By Mouth Daily 13)  Stool Softener 100 Mg Caps (Docusate Sodium) .... As Needed 14)  Zetia 10 Mg Tabs (Ezetimibe) .... 1/.2 Tab Every Other Day  Allergies: 1)  ! * Lyrica 2)  ! Ibuprofen  Past  History:  Past Medical History: Last updated: 09/07/2009 Current Problems:  PREMATURE VENTRICULAR CONTRACTIONS (ICD-427.69) CAD, ARTERY BYPASS GRAFT (ICD-414.04) CHF (ICD-428.0) UNSPECIFIED ESSENTIAL HYPERTENSION (ICD-401.9) HYPERLIPIDEMIA (ICD-272.4) CHRONIC KIDNEY DISEASE UNSPECIFIED (ICD-585.9) GOUT, UNSPECIFIED (ICD-274.9) DEGENERATIVE JOINT DISEASE (ICD-715.90) ANEMIA, IRON DEFICIENCY, HX OF (ICD-V12.3)    Past Surgical History: Last updated: 05/03/2009  1.  Lumbar surgery in March of 2006.  2.  Coronary artery bypass or CABG in 1993.  3.  Right shoulder surgery in 1991.  4.  Left knee surgery.  Review of Systems       The patient complains of dyspnea on exertion.  The patient denies chest pain, syncope, and peripheral edema.    Vital Signs:  Patient profile:   70 year old male Height:      70 inches Weight:      194 pounds BMI:     27.94 Pulse rate:   72 / minute BP sitting:   92 / 54  Vitals Entered By: Laurance Flatten CMA (August 24, 2010 9:44 AM)  Physical Exam  General:  Pleasant middle aged man NAD. Head:  normocephalic and atraumatic Eyes:  PERRLA/EOM intact; conjunctiva and lids normal. Mouth:  Teeth, gums and palate normal. Oral mucosa normal. Neck:  JVD 7 cm. Chest Wall:  Well healed ICD incision. Lungs:  Rales in the bases bilaterally.  No wheezes or rhonchi.  No increased work of breathing. Heart:  Regular RR with normal S1 and S2.  PMI is enlarged and laterally displaced.  A soft systolic murmur was present. Abdomen:  Bowel sounds positive; abdomen soft and non-tender without masses, organomegaly, or hernias noted. No hepatosplenomegaly. Msk:  Back normal, normal gait. Muscle strength and tone normal. Pulses:  pulses normal in all 4 extremities Extremities:  No clubbing or cyanosis. No edema. Neurologic:  Alert and oriented x 3.    ICD Specifications Following MD:  Lewayne Bunting, MD     Referring MD:  [facility] ICD Vendor:  [facility] Jude     ICD Model  Number:  [phone]     ICD Serial Number:  [id number] ICD DOI:  05/25/2009     ICD Implanting MD:  Lewayne Bunting, MD  Lead 1:    Location: RA     DOI: 05/25/2009     Model #: [id number]     Serial #: [id number]     Status: active Lead 2:    Location: RV     DOI: 05/25/2009     Model #: [id number]     Serial #: [id number]     Status: active Lead 3:    Location: LV     DOI: 05/25/2009     Model #: [id number]     Serial #: [id number]     Status: active  Indications::  ICM, CHF   ICD Follow Up ICD Dependent:  No       ICD Device Measurements Configuration: BIPOLAR  Episodes Coumadin:  No  Brady Parameters Mode VVI     Lower Rate Limit:  40     Upper Rate Limit 120 PAV 140     Sensed AV Delay:  90  Tachy Zones VF:  222     VT:  184     MD Comments:  Normal device function except for elevated LV capture. Device programmed VVI 40.  Impression & Recommendations:  Problem # 1:  AUTOMATIC IMPLANTABLE CARDIAC DEFIBRILLATOR SITU (ICD-V45.02) His device is stable except for LV capture threshold being elevated. Device is VVI.  Problem # 2:  CHF (ICD-428.0) His symptoms appear to be class 2. His updated medication list for this problem includes:    Carvedilol 25 Mg Tabs (Carvedilol) .Marland Kitchen... Take one tablet by mouth twice a day    Lisinopril 10 Mg Tabs (Lisinopril) .Marland Kitchen... 1 tab once daily    Aspirin Ec 325 Mg Tbec (Aspirin) .Marland Kitchen... Take one tablet by mouth daily  Problem # 3:  UNSPECIFIED ESSENTIAL HYPERTENSION (ICD-401.9) His blood pressure is well controlled. Will follow. His updated medication list for this problem includes:    Carvedilol 25 Mg Tabs (Carvedilol) .Marland Kitchen... Take one tablet by mouth twice a day    Lisinopril 10 Mg Tabs (Lisinopril) .Marland Kitchen... 1 tab once daily    Aspirin Ec 325 Mg Tbec (Aspirin) .Marland Kitchen... Take one tablet by mouth daily  Patient Instructions: 1)  Your physician recommends that you continue on your current medications as directed. Please refer to the Current Medication list given to you today. 2)   Your physician wants you to follow-up in: 6 M ONTHS WITH DR Court Joy will receive a reminder letter in the mail two months in advance. If you don't receive a letter, please call our office to schedule the follow-up appointment.

## 2010-09-11 ENCOUNTER — Encounter: Payer: Self-pay | Admitting: Family Medicine

## 2010-09-11 DIAGNOSIS — K635 Polyp of colon: Secondary | ICD-10-CM | POA: Insufficient documentation

## 2010-09-11 DIAGNOSIS — K449 Diaphragmatic hernia without obstruction or gangrene: Secondary | ICD-10-CM | POA: Insufficient documentation

## 2010-09-11 DIAGNOSIS — I493 Ventricular premature depolarization: Secondary | ICD-10-CM | POA: Insufficient documentation

## 2010-09-11 DIAGNOSIS — N529 Male erectile dysfunction, unspecified: Secondary | ICD-10-CM | POA: Insufficient documentation

## 2010-09-11 DIAGNOSIS — I252 Old myocardial infarction: Secondary | ICD-10-CM | POA: Insufficient documentation

## 2010-09-11 DIAGNOSIS — I2510 Atherosclerotic heart disease of native coronary artery without angina pectoris: Secondary | ICD-10-CM

## 2010-09-11 DIAGNOSIS — I10 Essential (primary) hypertension: Secondary | ICD-10-CM | POA: Insufficient documentation

## 2010-09-11 DIAGNOSIS — K219 Gastro-esophageal reflux disease without esophagitis: Secondary | ICD-10-CM | POA: Insufficient documentation

## 2010-09-11 DIAGNOSIS — Z951 Presence of aortocoronary bypass graft: Secondary | ICD-10-CM

## 2010-09-11 DIAGNOSIS — K649 Unspecified hemorrhoids: Secondary | ICD-10-CM | POA: Insufficient documentation

## 2010-09-13 NOTE — Cardiovascular Report (Signed)
Summary: Office Visit   Office Visit   Imported By: Roderic Ovens 09/09/2010 15:04:37  _____________________________________________________________________  External Attachment:    Type:   Image     Comment:   External Document

## 2010-09-27 LAB — URINALYSIS, MICROSCOPIC ONLY
Nitrite: NEGATIVE
Protein, ur: NEGATIVE mg/dL
Specific Gravity, Urine: 1.023 (ref 1.005–1.030)
Urobilinogen, UA: 0.2 mg/dL (ref 0.0–1.0)

## 2010-09-27 LAB — CBC
HCT: 40.5 % (ref 39.0–52.0)
MCV: 94.9 fL (ref 78.0–100.0)
RBC: 4.27 MIL/uL (ref 4.22–5.81)
WBC: 11.8 10*3/uL — ABNORMAL HIGH (ref 4.0–10.5)

## 2010-09-27 LAB — URINE CULTURE: Culture: NO GROWTH

## 2010-11-03 ENCOUNTER — Encounter: Payer: Self-pay | Admitting: Internal Medicine

## 2010-11-11 NOTE — Cardiovascular Report (Signed)
[facility]  Patient:    DEARIES, MEIKLE Visit Number: [id number] MRN: [id number]          Service Type: CAT Location: [facility] 2855 [id number] Attending Physician:  Lyn Records. Iii Dictated by:   Darci Needle, M.D. Proc. Date: 06/04/01 Admit Date:  06/04/2001   CC:         Monica Becton, M.D., [facility]  Cardiac Catheterization Lab   Cardiac Catheterization  INDICATION:  Matti Minney is 70 years old and is status post coronary artery bypass surgery 10 years ago following a lateral wall myocardial infarction. At that time, he had a free left internal mammary graft to the LAD, a saphenous vein graft to the diagonal, saphenous vein graft to an obtuse marginal, and sequential saphenous vein graft to the PDA and posterolateral of the right coronary.  He has done well since that time but recently had a Cardiolite study that demonstrates a lateral wall infarct with peri-infarct ischemia.  This study is being done to rule out any evidence of bypass graft occlusion or progressive of native vessel coronary disease.  PROCEDURES PERFORMED: 1. Left heart catheterization. 2. Selective coronary angioplasty. 3. Left ventriculography. 4. Saphenous vein graft angiography. 5. Left internal mammary graft angiography (free graft).  CARDIOLOGIST:  Darci Needle, M.D.  DESCRIPTION:  After informed consent, a 6-French sheath was placed in the right femoral artery using the modified Seldinger technique.  A 6-French A2 multipurpose catheter was used for hemodynamic recordings, left ventriculography by hand injection in both the LAO and RAO projections, and native right coronary and saphenous vein graft angiography.  We used a 6-French #4 left Judkins catheter for left coronary angiography.  The patient tolerated the procedure without complications.  RESULTS: I.   Hemodynamic data:      a. Aortic pressure 86/54 mmHg.      b. Left ventricular pressure 90/14  mmHg. II.  Left ventriculography:  The left ventricle is mildly dilated.  The      lateral wall is mildly to moderately hypokinetic.  EF is estimated to      be in the 45 to 55% range.  No significant mitral regurgitation is      felt to be present. III. Selective coronary angiography.      a. Left main coronary: Widely patent.  Moderate calcification.  No         significant obstruction.      b. Left anterior descending coronary: The LAD is totally occluded         after the second septal perforator.  The LAD proximal to the first         septal perforator is segmentally narrowed up to 95%.      c. Circumflex artery: The circumflex is patent.  There is irregularity         in the proximal vessel.  The first obtuse marginal is totally         occluded.  No significant obstruction is noted throughout the         remainder of the circumflex territory.      d. Right coronary:  The right coronary is totally occluded proximally.         It is heavily calcified. IV.  Bypass graft angiography including free internal mammary graft:      a. Free internal mammary graft to the LAD: This graft arises from the         hood  of saphenous vein graft to the diagonal.  It attaches into the         mid LAD.  It is widely patent and contains no significant areas of         obstruction.      b. Saphenous vein graft to the diagonal: This graft is widely patent.         As mentioned above, it gives the pedicle to which the free internal         mammary graft is attached.  The diagonal is free of any significant         obstruction.  The saphenous vein graft is free of any significant         obstruction.      c. Saphenous vein graft to the obtuse marginal: This graft is large         and widely patent.  The obtuse marginal gives antegrade distal         flow and retrograde flow into a small obtuse marginal more distal         in the anatomic arterial tree.      d. Saphenous vein sequential graft to the PDA  and posterolateral of the         right coronary.  This graft is widely patent.  There is mild diffuse         disease in the native right coronary distal to the graft insertion         sites.  The graft is widely patent and appears normal.  CONCLUSIONS: 1. Mild left ventricular dysfunction with segmental wall motion abnormality    involving the lateral wall.  Ejection fraction is in the 45 to 55% range. 2. Severe native vessel coronary disease with total occlusion of the    left anterior descending artery, total occlusion of a large first obtuse    marginal, and total occlusion of the right coronary. 3. Widely patent saphenous vein grafts and free left internal mammary graft as    noted above.  RECOMMENDATIONS:  Continue risk factor modification.  Medical therapy.  No intervention is necessary.  Consider beta blocker therapy in this patient given the mild reduction in LV function and the mild increase in left ventricular cavity size.  The only problem is the patients blood pressure tends to run relatively low, and he may not be able to tolerate additional medications that could potentially lower his blood pressure. Dictated by:   Darci Needle, M.D. Attending Physician:  Lyn Records. Iii DD:  06/04/01 TD:  06/04/01 Job: [id number]

## 2010-11-11 NOTE — Consult Note (Signed)
NAMEDARRILL, VREELAND                 ACCOUNT NO.:  [id number]   MEDICAL RECORD NO.:  [id number]          PATIENT TYPE:  INP   LOCATION:  2110                         FACILITY:  [facility]   PHYSICIAN:  Cristi Loron, M.D.DATE OF BIRTH:  09-14-1940   DATE OF CONSULTATION:  09/25/2004  DATE OF DISCHARGE:                                   CONSULTATION   CHIEF COMPLAINT:  Mental status changes.   HISTORY OF PRESENT ILLNESS:  The patient is a 69 year old white male well  known to me who I performed a right L3/4 far lateral microdiscectomy one  month ago for a far lateral herniated disc. The patient's surgery went well  and was unremarkable. His postoperative course was initially unremarkable. I  saw him back in the office about a week ago secondary to his routine follow-  up. At that time, he had some persistent leg pain. I attributed this to the  far lateral nature of his herniated disc, i.e. persistent radiculopathy with  far lateral disc herniations. I treated him with medications and started him  on OxyContin as well as Lyrica for neuropathic pain. According to the  patient's wife, his pain became much better; however, yesterday, he began  having some confusion. She called me last night and we thought perhaps it  was secondary to his medications, either the Lyrical or OxyContin and I  recommended she cut back on the Lyrica to 100 mg p.o. q.h.s. He did not seem  to improve and she brought him to the emergency department in the early a.m.  of September 25, 2004. He was worked up in the emergency department. He was found  to be febrile and is concerned about the sepsis. He had increased cardiac  enzymes and the patient was subsequently admitted by the critical care team  and Dr. Marcelyn Bruins has seen the patient.   Dr. Marcelyn Bruins is concerned about septic shock and an occult infection and  has been commenting on the possibility of having a lumbar abscess.  Presently, the patient denies pain  anywhere. He denies chest pain, abdominal  pain, back pain, leg pain. He is alert and oriented.   PAST MEDICAL HISTORY:  Obtained in his admission history and physical one  month ago.   MEDICATIONS:  Obtained in his admission history and physical one month ago.   ALLERGIES:  Obtained in his admission history and physical one month ago.   FAMILY HISTORY:  Obtained in his admission history and physical one month  ago.   SOCIAL HISTORY:  Obtained in his admission history and physical one month  ago.   REVIEW OF SYMPTOMS:  Negative except as above. He denies any pain.   PHYSICAL EXAMINATION:  GENERAL:  A pleasant mildly confused 70 year old  white male.  VITAL SIGNS:  Temperature is 97.8 orally, blood pressure is 100/70, heart  rate is 80's to 90's, respirations are 13. Room air saturations are 98%.  HEENT:  Normocephalic. Pupils are equal, round, and reactive to light.  Extraocular movements intact.  NECK:  Supple. There were no masses or deformities. There  is no meningismus.  Thorax is symmetric.  LUNGS:  Diffuse rhonchi.  HEART:  Regular rhythm with occasional PVCs.  ABDOMEN:  Soft and nontender. No guarding or rebound.  EXTREMITIES:  No obvious deformities.  BACK:  His lumbar excision looks great. I do not see any evidence of  infection. He is nontender to palpation.  NEUROLOGICAL:  The patient is currently alert and oriented x 3. Glasgow coma  scale 15. Cranial nerves II through XII grossly intact. Motor strength is  5/5 in psoas, quadriceps, gastrocnemius, extensor longus. Deep tendon  reflexes are trace as well as bilateral quadriceps and gastrocnemius. There  is no ankle clonus. Sensory exam is intact to light touch in all tested  dermatomes bilaterally. Cerebellar function was not tested.   LABORATORY DATA:  White count is 20, hemoglobin is 12.3. Sodium is 132. BUN  and creatinine are 53 and 3.4. Sodium 126. Troponin is 1.29. His urinalysis  looks okay. Cultures are  pending.   Chest x-ray demonstrates no clear evidence of pneumonia. There is some  vascular congestion.   ASSESSMENT:  1.  Decreased mental status.  2.  Leukocytosis.  3.  Fever, question of possible sepsis.   RECOMMENDATIONS:  I have discussed the situation with Dr. Marcelyn Bruins.  While people who have disc space infection such as discitis or small  abscesses, they rarely get this particularly sick from this. They usually  presents with back pain; however, I certainly cannot state with certainty  that the patient does not have an infection in his back. Ideally, I would  like to work this up with a lumbar MRI with and without contrast as it is  the best imaging study in the postoperative period. It is very difficult  even with a MRI scan to tell whether or not we are looking normal  postoperative fluid collection or infection. Unfortunately, the patient's  BUN and creatinine are elevated and I do not think he will be able to  tolerate a prolonged stay in the scanner. I do not think it is safe to put  him the scanner for the time necessary to get a lumbar MRI. So, I think the  best study we can get currently would be a CAT scan without contrast but as  above, this is going ot be somewhat difficult to know whether or not that  any fluid we see on there is affected or not affected but at least it would  give [place] something that we could possibly needle biopsy.   The second question is whether or not we can perform a lumbar puncture. I  think that we should wait to see what the CAT scan shows first and then if  it looks okay and then perhaps do a lumbar puncture at a level remote to the  surgery.   In summary, I somewhat doubt that this is coming from an infected lumbar  wound as people do not generally get this sick and his wound looks fine;  however, I cannot say this with certainty and I think the best workup will  be, given the current situation, would be a CAT scan without contrast.  If it demonstrates fluid collection, I will get a needle biopsy of this and  tentatively plan to do a lumbar puncture at a site remote to the surgery.      JDJ/MEDQ  D:  09/25/2004  T:  09/25/2004  Job:  [id number]

## 2010-11-11 NOTE — Op Note (Signed)
NAME:  [name], [name]                 ACCOUNT NO.:  [id number]   MEDICAL RECORD NO.:  [id number]          PATIENT TYPE:  INP   LOCATION:  3021                         FACILITY:  [facility]   PHYSICIAN:  Cristi Loron, M.D.DATE OF BIRTH:  02-Jan-1941   DATE OF PROCEDURE:  08/25/2004  DATE OF DISCHARGE:                                 OPERATIVE REPORT   PREOPERATIVE DIAGNOSIS:  Right L3-4 far lateral herniated nucleus pulposus.   POSTOPERATIVE DIAGNOSIS:  Right L3-4 far lateral herniated nucleus pulposus.   OPERATION PERFORMED:  Right L3-4 far lateral microdiskectomy using  microdissection.   SURGEON:  Cristi Loron, M.D.   ASSISTANT:  Clydene Fake, M.D.   ANESTHESIA:  General endotracheal.   ESTIMATED BLOOD LOSS:  50 mL   SPECIMENS:  None.   DRAINS:  None.   COMPLICATIONS:  None.   INDICATIONS FOR PROCEDURE:  The patient is a 70 year old white male who has  suffered from severe back and right leg pain consistent with a right L3  radiculopathy.  He failed medical management and was worked up with a lumbar  MRI which demonstrated he had scoliosis and multilevel degenerative changes.  However, he had a severe foraminal stenosis at L3-4 on the right secondary  to a foraminal herniated disk as well as facet arthropathy.  I discussed the  various treatment options with him including surgery.  The patient weighed  the risks, benefits and alternatives to surgery and elected to proceed with  a right L3-4 far lateral microdiskectomy.   DESCRIPTION OF PROCEDURE:  The patient was brought to the operating room by  the anesthesia team.  General endotracheal anesthesia was induced.  The  patient was then turned to the prone position on the Wilson frame.  His  lumbosacral region was then prepared with Betadine scrub and Betadine  solution and sterile drapes were applied.  I then injected the area to be  incised with Marcaine with epinephrine solution.  I used a scalpel to make a  linear midline incision over the L3-4 interspace.  I used electrocautery to  dissect down to the thoracolumbar fascia, divided the fascia just to the  right of the midline performing a right-sided subperiosteal dissection  stripping the paraspinous musculature from the right spinous process and  lamina of L3 and 4.  I inserted the McCullough retractor for exposure and  then obtained intraoperative radiograph to confirm our location.  I then  used electrocautery to expose the lateral aspect of the right L3 pars region  and then we then brought the operating microscope into the field and under  its magnification and illumination, completed the  microdissection/decompression.  I used a high speed drill to remove the  lateral aspect of the right L3 pars region.  We then under the magnification  and illumination of the microscope, dissected through the intertransverse  ligament and then removed the ligament with the Kerrison punch and then  dissected through the intertransverse muscle and carefully identified the  exiting L3 nerve root. I then freed up the nerve root using microdissection  and then Dr.  Phoebe Perch carefully retracted the nerve root in a cephalad and  lateral direction exposing the underlying disk herniation.  I should note  that there was considerable foraminal stenosis secondary to overgrowth of  the facet and we performed a foraminotomy about the right L3 nerve root  using Kerrison punch.  I then dissected into the disk herniation and removed  multiple fragments of disk herniation using the pituitary forceps.  We did a  partial intervertebral diskectomy at this point.  We then removed some  spondylosis from the vertebral end plates to further decompress the L3 nerve  roots.  At this point there was a good decompression of nerve roots and we  palpated along the ventral surface of the nerve root all the way from the  interspinal portion out into the soft tissues and noted it was  well  decompressed.  We obtained stringent hemostasis using bipolar  electrocautery.  We copiously irrigated the wound out with bacitracin solution.  I then  removed the solution, removed the [facility] retractor. I did deposit some  Depo-Medrol and fentanyl solution in the epidural space. We then  reapproximated the patient's thoracolumbar fascia with interrupted #1 Vicryl  sutures, subcutaneous tissue with interrupted 2-0 Vicryl suture and the skin  with Steri-Strips and benzoin. The wound was then coated with bacitracin  ointment, sterile dressing was applied, the drapes were removed.  The  patient was subsequently returned to supine position where he was extubated  by the anesthesia team and transported to the post anesthesia care unit in  stable condition.  All sponge, needle and instrument counts were correct at  the end of the case.      JDJ/MEDQ  D:  08/25/2004  T:  08/26/2004  Job:  [id number]

## 2010-11-11 NOTE — H&P (Signed)
NAME:  JUDY, GOODENOW                 ACCOUNT NO.:  [id number]   MEDICAL RECORD NO.:  [id number]          PATIENT TYPE:  INP   LOCATION:                               FACILITY:  [facility]   PHYSICIAN:  Wilber Bihari. Caryn Section, M.D.   DATE OF BIRTH:  08-26-1940   DATE OF ADMISSION:  09/25/2004  DATE OF DISCHARGE:                                HISTORY & PHYSICAL   HISTORY OF PRESENT ILLNESS:  Mr. Bowns is a 70 year old white male who  presented to the ED on September 25, 2004 with a chief complaint of altered  mental status and hypotension secondary to lumbar surgery in March of 2006.  The patient's physician put him on Lyrica 50 mg t.i.d. and OxyContin 20 mg  b.i.d.  While on a trip with his wife the patient's wife noticed that his  mental status was declining.  She initially thought that it would take him a  while to get adjusted to the medication; however, the patient never did  adjust.  She brought him into the ED on September 25, 2004 at which time the  patient was agitated and febrile, both of which have resolved today.  The  spike in temperature is believed to be the result in sepsis which is  secondary to unknown causes.  The patient has a history of hypertension,  coronary artery disease/congestive heart failure, membranous nephropathy,  and degenerative disk disease.  While in the hospital the patient has been  given IV vancomycin as well as IV Cipro and p.o. Lasix.  The nephrology  service was consulted today due to the patient's creatinine level of 3.9.  Upon seeing the patient today he denies any leg or back pain, abdominal  pain, chest pain, shortness of breath, diaphoresis, or palpitations.  He  also denies polyuria, hematuria, or dysuria.  Mr. Pettet denies any decreased  stream; however, he does admit to polydipsia.  He denies fever or chills.  The patient's creatinine upon admission was 3.3, then it decreased to 1.3 on  April 5 and is back up to 3.9 as of today.  Mr. Wessell reported the  development of an abscess secondary to a Staph infection that he developed  following right shoulder surgery in 2001.   PAST MEDICAL HISTORY:  1.  Hypertension.  2.  Dyslipidemia.  3.  Coronary artery disease/congestive heart failure with an ejection      fraction of 30-40%.  4.  Degenerative joint disease.   PAST SURGICAL HISTORY:  1.  Lumbar surgery in March of 2006.  2.  Coronary artery bypass or CABG in 1993.  3.  Right shoulder surgery in 1991.   CURRENT MEDICATIONS:  1.  Xopenex which has been put on hold.  2.  Restoril.  3.  Lasix.  4.  Fentanyl patch.  5.  Lyrica which has been put on hold.  6.  Lisinopril.  7.  Potassium.  8.  Coreg.  9.  Low molecular weight heparin.  10. Morphine sulfate.  11. IV vancomycin.  12. IV Cipro.  13. Aspirin 325 mg which has also  been put on hold.  14. OxyContin 20 mg b.i.d. which has also been put on hold.   MEDICATIONS PRIOR TO ADMISSION:  1.  Zestoretic 20/12.5 mg daily.  2.  Pravachol 80 mg daily.  3.  Desyrel 50 mg p.r.n.  4.  Restoril 30 mg q.h.s.  5.  Niaspan 2000 mg daily.  6.  Aspirin 325 mg daily.  7.  Multivitamin daily.  8.  Actos 15 mg daily.  9.  Coreg 12.5 mg b.i.d.  10. OxyContin 20 mg b.i.d.  11. Lyrica 50 mg t.i.d.   ALLERGIES:  No known drug allergies; however, the patient reports a  sensitivity to NSAIDs due to prior renal impairment when he was using those  so he no longer uses NSAIDs.   REVIEW OF SYSTEMS:  All systems were reviewed and are negative as otherwise  stated in the HPI.   FAMILY HISTORY:  His mom is living at 65 years old and has a history of  hypertension.  His dad was deceased at the age of 46 from prostate cancer.  His paternal aunt has a history of diabetes.  He has a brother age 57 who is  healthy and a sister age unknown who is also healthy.   SOCIAL HISTORY:  Mr. 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  He  is  married and lives with his wife of 31 years.  He has four daughters and 11  grandchildren all of which are healthy.  He denies any drug or alcohol use.  Smoking cessation occurred five years ago.   PHYSICAL EXAMINATION:  VITAL SIGNS:  Temperature 98.3, blood pressure  102/60, heart rate 82, respirations 16.  SAO2 O2 saturations are 89/5 L of  O2 via nasal cannula.  HEENT:  Mouth:  Mucous membranes are pink and moist.  Lips are dry.  HEART/CARDIOVASCULAR:  Regular rate and rhythm.  S1 and S2 distinct without  murmurs, rubs, or gallops.  No JVD.  Brachial pulses are equal and  bilateral.  LUNGS:  Clear to auscultation bilaterally.  No rhonchi, wheezes, crackles,  or rales.  ABDOMEN:  Soft, nontender, nondistended.  Positive bowel sounds.  No  organomegaly.  NEUROLOGIC:  Alert and oriented x3.  No focal deficits.  EXTREMITIES:  Lower extremities:  No edema.  +2 DP and PT pulses.   LABORATORIES:  As of yesterday, Mr. Tortorella has a hemoglobin of 10.2, a  hematocrit of 29, and white blood count of 12.5.  As of today, he has a BUN  of 35, a creatinine of 3.9, and a blood glucose of 111.   BNP is 179.2 and an MRI revealed no abscesses and no diskitis.   ASSESSMENT:  1.  Acute renal failure, possibly secondary to volume depletion.  2.  Sepsis secondary to unknown causes.  3.  Anemia.  4.  Hyperglycemia.  5.  Degenerative disk disease.  6.  Hypertension.  7.  Coronary artery disease/congestive heart failure with an ejection      fraction of 30-40%.   PLAN:  1.  IV fluids of normal saline stat 250 mL/hour x4 hours, then 40 mL/hour.      1.  UA.      2.  Renal ultrasound to rule out obstruction.      3.  SPEP and UPEP.      4.  Discontinue Lasix and discontinue ACE inhibitor.  2.  Continue with IV  antibiotics.  3.  Check iron ferritin level as well as iron TIBC iron stores.      ________________________________________ Kizzie Furnish, P.A.   ___________________________________________  Wilber Bihari. Caryn Section, M.D.    RM/MEDQ  D:  10/03/2004  T:  10/03/2004  Job:  [id number]

## 2010-11-11 NOTE — Op Note (Signed)
[facility]  Patient:    [name], [name] Visit Number: [id number] MRN: [id number]          Service Type: DSU Location: [facility] Attending Physician:  Randolm Idol Dictated by:   Claude Manges. Cleophas Dunker, M.D. Proc. Date: 03/12/01 Admit Date:  03/12/2001                             Operative Report  PREOPERATIVE DIAGNOSIS:  Displaced bucket-handle tear of medial meniscus, left knee.  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  OPERATION PERFORMED: 1. Diagnostic arthroscopy of left knee. 2. Arthroscopic medial meniscectomy. 3. Debridement of patella and medial femoral condyle.  SURGEON:  Claude Manges. Cleophas Dunker, M.D.  ANESTHESIA:  IV sedation with local 1% Xylocaine with epinephrine.  COMPLICATIONS:  None.  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  He also could not fully extend the knee.  His wife drove him to [facility] and he was evaluated in the office last evening with evidence of a locked knee and most like a medial meniscal tear.  He is now to have an arthroscopic evaluation.  DESCRIPTION OF PROCEDURE:  With the patient comfortable on the operating table under IV sedation.  The left lower extremity was placed in the thigh holder. The leg was then prepped with DuraPrep from the thigh holder to the ankle. Sterile draping was performed.  Diagnostic arthroscopy was performed using a medial and lateral parapatellar tendon stab wound.  Irrigation was not necessarily through-and-through a portal.  There was a serosanguineous effusion.  Diagnostic arthroscopy revealed minimal synovitis in the superior pouch.  There was  diffuse chondromalacia of the patella probably grade 2 changes where there was diffuse thinning and frond formation.  There was no evidence of a tilt.  There were no loose bodies in the superior pouch.  Shaving of the patella was performed to stabilize the articular surface.  The lateral compartment was clear of meniscal pathology or chondromalacia. There was some thinning of the ACL and a longitudinal split.  He did not have an anterior drawer sign and I felt that the ACL was stable but there had been some trauma to it either chronically or acutely.  In the medial compartment there was an obvious anteriorly displaced bucket handle tear of the medial meniscus.  Through the medial portal, I reduced it and then removed the torn portion with the intra-articular shaver and the basket forceps.  The remaining rim was tapered posteriorly and anteriorly.  It represented probably at least the middle 5/8 of meniscus.  I did insert a Bovie and coagulated any bleeding surfaces around the meniscal rim or along the intercondylar notch.  I again probed the remaining meniscal tissue posteriorly and anteriorly and it was perfectly stable.  There was considerable chondromalacia of the medial femoral condyle with several large areas laterally in the medial femoral condyle with ____________ .  The joint was copiously irrigated with saline solution.  I checked to be sure there were no loose pieces of cartilage or meniscus and there were none.  Two stab wounds were left open and infiltrated with 0.25% Marcaine with epinephrine.  A sterile bulky dressing was applied followed  by an Ace bandage. The knee was fully extendable at that time whereas preoperative, he lacked about 10 to 15 degrees of full extension.  PLAN:  Percocet for pain, office one week. Dictated by:   Claude Manges. Cleophas Dunker, M.D. Attending Physician:  Randolm Idol DD:  03/12/01 TD:  03/12/01 Job: [id number]

## 2010-11-11 NOTE — Procedures (Signed)
[facility]  Patient:    [name], [name]                        MRN: [id number] Proc. Date: 03/27/00 Adm. Date:  [id number] Attending:  Deneen Harts CC:         Vernon Prey, M.D., [place], [place]   Procedure Report  PROCEDURE:  Colonoscopic polypectomy.  INDICATION:  A 70 year old white male with a history of colon polyps dating to 34.  Subsequent colonoscopy was normal in 1996.  The patient has remained asymptomatic.  Undergoing repeat examination for surveillance.  DESCRIPTION OF PROCEDURE:  After reviewing the nature of the procedure with the patient including potential risks and complications, and after discussing alternative methods of diagnosis and treatment, informed consent was signed.  The patient was premedicated receiving IV sedation totalling Versed 10.5 mg, fentanyl 100 mcg administered in divided doses prior to and during the course of the procedure.  Digital examination revealed no evidence of perianal lesion.  Interrectal exam revealed normal prostate and large, tender internal hemorrhoids.  Using an Olympus PCF-140L pediatric video colonoscope, rectum was intubated, and the scope was advanced around the entire length of the colon without difficulty. The cecum identified by the appendiceal orifice and ileocecal valve. Photodocumentation obtained.  The scope was slowly withdrawn with careful inspection of the entire colon in a retrograde manner including retroflex view in the rectal vault.  At the rectosigmoid junction, approximately 20 cm, a diminutive polyp, 5 mm in diameter was resected using hot biopsy forceps, recovered, and submitted to pathology.  A second even smaller polyp was adjacent to this and was also resected and submitted.  The only other findings included very minimal sigmoid diverticulosis.  Retroflex view revealed large, inflamed internal hemorrhoids. The colon was decompressed and scope withdrawn.  The patient  tolerated the procedure without difficulty being maintained on Datascope monitor and low-flow oxygen throughout.  Returned to recovery in stable condition.  Time 1, technical 2, preparation 1, total score = 4.  ASSESSMENT: 1. Diminutive polyps at 20 cm - resected, pathology pending. 2. Sigmoid diverticulosis - mild. 3. Internal hemorrhoids - inflamed.  RECOMMENDATIONS: 1. Follow up pathology. 2. Postpolypectomy instructions reviewed. 3. Repeat colonoscopy in five years. 4. Canasa 500 mg suppository 1 p.r. b.i.d. for five days, repeat p.r.n. DD:  03/27/00 TD:  03/27/00 Job: [id number]

## 2010-11-11 NOTE — H&P (Signed)
NAME:  [name], [name]                 ACCOUNT NO.:  [id number]   MEDICAL RECORD NO.:  [id number]          PATIENT TYPE:  INP   LOCATION:                               FACILITY:  [facility]   PHYSICIAN:  Wilber Bihari. Caryn Section, M.D.   DATE OF BIRTH:  [birth year]   DATE OF ADMISSION:  09/25/2004  DATE OF DISCHARGE:                                HISTORY & PHYSICAL   HISTORY OF PRESENT ILLNESS:  Mr. Armstrong is a 70 year old white male who  presented to the ED on September 25, 2004 with a chief complaint of altered  mental status and hypotension secondary to lumbar surgery in March of 2006.  The patient's physician put him on Lyrica 50 mg t.i.d. and OxyContin 20 mg  b.i.d.  While on a trip with his wife the patient's wife noticed that his  mental status was declining.  She initially thought that it would take him a  while to get adjusted to the medication; however, the patient never did  adjust.  She brought him into the ED on September 25, 2004 at which time the  patient was agitated and febrile, both of which have resolved today.  The  spike in temperature is believed to be the result in sepsis which is  secondary to unknown causes.  The patient has a history of hypertension,  coronary artery disease/congestive heart failure, membranous nephropathy,  and degenerative disk disease.  While in the hospital the patient has been  given IV vancomycin as well as IV Cipro and p.o. Lasix.  The nephrology  service was consulted today due to the patient's creatinine level of 3.9.  Upon seeing the patient today he denies any leg or back pain, abdominal  pain, chest pain, shortness of breath, diaphoresis, or palpitations.  He  also denies polyuria, hematuria, or dysuria.  Mr. Elms denies any decreased  stream; however, he does admit to polydipsia.  He denies fever or chills.  The patient's creatinine upon admission was 3.3, then it decreased to 1.3 on  April 5 and is back up to 3.9 as of today.  Mr. Kirby reported the  development of an abscess secondary to a Staph infection that he developed  following right shoulder surgery in 2001.   PAST MEDICAL HISTORY:  1.  Hypertension.  2.  Dyslipidemia.  3.  Coronary artery disease/congestive heart failure with an ejection      fraction of 30-40%.  4.  Degenerative joint disease.   PAST SURGICAL HISTORY:  1.  Lumbar surgery in March of 2006.  2.  Coronary artery bypass or CABG in 1993.  3.  Right shoulder surgery in 1991.   CURRENT MEDICATIONS:  1.  Xopenex which has been put on hold.  2.  Restoril.  3.  Lasix.  4.  Fentanyl patch.  5.  Lyrica which has been put on hold.  6.  Lisinopril.  7.  Potassium.  8.  Coreg.  9.  Low molecular weight heparin.  10. Morphine sulfate.  11. IV vancomycin.  12. IV Cipro.  13. Aspirin 325 mg which has also  been put on hold.  14. OxyContin 20 mg b.i.d. which has also been put on hold.   MEDICATIONS PRIOR TO ADMISSION:  1.  Zestoretic 20/12.5 mg daily.  2.  Pravachol 80 mg daily.  3.  Desyrel 50 mg p.r.n.  4.  Restoril 30 mg q.h.s.  5.  Niaspan 2000 mg daily.  6.  Aspirin 325 mg daily.  7.  Multivitamin daily.  8.  Actos 15 mg daily.  9.  Coreg 12.5 mg b.i.d.  10. OxyContin 20 mg b.i.d.  11. Lyrica 50 mg t.i.d.   ALLERGIES:  No known drug allergies; however, the patient reports a  sensitivity to NSAIDs due to prior renal impairment when he was using those  so he no longer uses NSAIDs.   REVIEW OF SYSTEMS:  All systems were reviewed and are negative as otherwise  stated in the HPI.   FAMILY HISTORY:  His mom is living at 37 years old and has a history of  hypertension.  His dad was deceased at the age of 65 from prostate cancer.  His paternal aunt has a history of diabetes.  He has a brother age 66 who is  healthy and a sister age unknown who is also healthy.   SOCIAL HISTORY:  Mr. 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  He  is  married and lives with his wife of 31 years.  He has four daughters and 11  grandchildren all of which are healthy.  He denies any drug or alcohol use.  Smoking cessation occurred five years ago.   PHYSICAL EXAMINATION:  VITAL SIGNS:  Temperature 98.3, blood pressure  102/60, heart rate 82, respirations 16.  SAO2 O2 saturations are 89/5 L of  O2 via nasal cannula.  HEENT:  Mouth:  Mucous membranes are pink and moist.  Lips are dry.  HEART/CARDIOVASCULAR:  Regular rate and rhythm.  S1 and S2 distinct without  murmurs, rubs, or gallops.  No JVD.  Brachial pulses are equal and  bilateral.  LUNGS:  Clear to auscultation bilaterally.  No rhonchi, wheezes, crackles,  or rales.  ABDOMEN:  Soft, nontender, nondistended.  Positive bowel sounds.  No  organomegaly.  NEUROLOGIC:  Alert and oriented x3.  No focal deficits.  EXTREMITIES:  Lower extremities:  No edema.  +2 DP and PT pulses.   LABORATORIES:  As of yesterday, Mr. Opdahl has a hemoglobin of 10.2, a  hematocrit of 29, and white blood count of 12.5.  As of today, he has a BUN  of 35, a creatinine of 3.9, and a blood glucose of 111.   BNP is 179.2 and an MRI revealed no abscesses and no diskitis.   ASSESSMENT:  1.  Acute renal failure, possibly secondary to volume depletion.  2.  Sepsis secondary to unknown causes.  3.  Anemia.  4.  Hyperglycemia.  5.  Degenerative disk disease.  6.  Hypertension.  7.  Coronary artery disease/congestive heart failure with an ejection      fraction of 30-40%.   PLAN:  1.  IV fluids of normal saline stat 250 mL/hour x4 hours, then 40 mL/hour.      1.  UA.      2.  Renal ultrasound to rule out obstruction.      3.  SPEP and UPEP.      4.  Discontinue Lasix and discontinue ACE inhibitor.  2.  Continue with IV  antibiotics.  3.  Check iron ferritin level as well as iron TIBC iron stores.   ADDENDUM:  job #[id number]     ________________________________________  Kizzie Furnish, P.A.   ___________________________________________  Wilber Bihari. Caryn Section, M.D.    RM/MEDQ  D:  10/03/2004  T:  10/03/2004  Job:  [id number]   cc:   [name], M.D.  [address] [place]  [place] [address]  Fax: [phone]

## 2010-11-11 NOTE — Consult Note (Signed)
NAMESABEN, [name]                 ACCOUNT NO.:  [id number]   MEDICAL RECORD NO.:  [id number]          PATIENT TYPE:  INP   LOCATION:  2110                         FACILITY:  [facility]   PHYSICIAN:  Ulyses Amor, MD DATE OF BIRTH:  [birth year]   DATE OF CONSULTATION:  09/25/2004  DATE OF DISCHARGE:                                   CONSULTATION   [name] is a 70 year old white man who was admitted to [facility] because of suspected sepsis.  The source of the sepsis is not  clear.  However, he recent underwent lumbar surgery, spine surgery and that  site is being investigated and that site is being investigated as a possible  source.  In the hospital, he has demonstrated mental status change as well  as hypotension.  A cardiology consultation was requested because of abnormal  cardiac enzyme and BMP.   The patient has a history of coronary artery disease, having undergone  coronary artery bypass surgery in the past.  In addition, he has a history  of congestive heart failure.  However, his cardiac condition has been stable  and asymptomatic in recent years.  The family reports that he has not  experienced any chest pain, tightness, heaviness, pressure or squeezing.  Nor has there been any dyspnea.   In addition to the coronary artery disease, the patient has underlying renal  failure.  There is also dyslipidemia.  There is also a history of  dyslipidemia.   PHYSICAL EXAMINATION:  VITAL SIGNS:  Blood pressure 106/64, pulse 149 and  regular, respiratory rate 20, saturation 98% on 60%.  GENERAL:  The patient was a lethargic, older white man.  He was in no  distress.  NECK:  Without thyromegaly or adenopathy.  Carotid pulses were palpable  bilaterally and without bruit.  CARDIAC:  Examination revealed a rapid regular rhythm.  There was no gallop,  rub, murmur or click.  No chest wall tenderness was noted.  LUNGS:  Without rales.  ABDOMEN:  Soft and nontender.  There  was no mass, tenderness,  hepatosplenomegaly, bruit, distension, rebound, guarding, or rigidity.  EXTREMITIES:  Without edema, deviation, or deformity.  Radial and dorsalis  pedal pulses were palpable bilaterally.  NEUROLOGICAL:  Brief screening neurologic survey revealed no focal  neurologic findings.   The most recent electrocardiogram revealed sinus tachycardia, left anterior  fascicular block, and nonspecific ST and T wave abnormalities.  There was no  specific evidence of ischemia or infarction.  The BUN was 53 and creatinine  3.4.  The first set of cardiac enzymes revealed a CK of 210, CK MB 2.3, and  relative index of 1.1.  Troponin was 129.  The second set revealed a CK of  184, CK MB of 3.7, relative index of 2.0, and troponin is 0.57.  The third  set revealed a CK of 162, CK MB 5.2, relative index of 3.2, and troponin of  0.72.  BMP was 979.6.   IMPRESSION:  1.  Equivocal cardiac enzymes.  All the CK, CK MB and relative indices are  normal.  The troponins are 1.29, 0.57, and 0.72.  The elevated troponins      could be explained by the renal failure alone.  Suspect that an acute      myocardial infarction is not present.  The elevated BMP may be due to      the unstable hemodynamic picture.  2.  Hypotension.  This is probably not due to left ventricular dysfunction.      Suspect sepsis as has been postulated.  3.  History of coronary artery disease; status post coronary artery bypass      surgery; history of congestive heart failure.  4.  Renal failure, suspected sepsis.   RECOMMENDATIONS:  1.  Continue cardiac enzymes.  2.  Echocardiogram to assess left ventricular function.  3.  Hemodynamic support as has been outlined by critical care medicine.  4.  Dr. Katrinka Blazing will follow with you beginning in the morning.      MSC/MEDQ  D:  09/25/2004  T:  09/25/2004  Job:  [id number]   cc:   Lyn Records III, M.D.  [address]  [address]  [place]  [place] [address]  Fax:  [phone]

## 2010-11-11 NOTE — Op Note (Signed)
[place]  Patient:    [name], [name]                        MRN: [id number] Proc. Date: 01/06/00 Adm. Date:  [id number] Attending:  Randolm Idol                           Operative Report  PREOPERATIVE DIAGNOSIS:  Mass, right neck, possible abscess.  POSTOPERATIVE DIAGNOSIS:  Mass, right neck, possible abscess.  OPERATION PERFORMED:  Biopsy and incision and drainage of abscess, right neck.  SURGEON:  Claude Manges. Cleophas Dunker, M.D.  ANESTHESIA:  General orotracheal.  COMPLICATIONS:  None.  INDICATIONS FOR PROCEDURE:  This 70 year old gentleman is approximately five months status post excision of the distal right clavicle.  He was having considerable pain.  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 fever or chills, he was watched over a period of time.  He was able to return to his normal activity playing golf etc.  In the last four to six weeks he has had increasing pain on the right side of his neck.  He has reached a point where he is most uncomfortable and has had some low-grade temperatures.  His white blood cell count was 11,300 with an elevated sed rate of 94.  An MRI scan of his neck revealed a cystic area possibly hemorrhagic or purulent or possibly even a tumor.  He is now to have biopsy and incision and drainage of the area.  DESCRIPTION OF PROCEDURE:  With the patient comfortable on the operating table and under general orotracheal anesthesia, he was placed in slight sitting position rotating his neck towards the left.  The neck and shoulder were then prepped with DuraPrep and the mass located just posterior to the midlateral right neck was draped in the usual sterile manner.  A longitudinal incision beginning posteriorly coursing anteriorly approximately 1-1/2 inches to 2 inches was made via sharp dissection and carried down to subcutaneous  tissue. Small bleeders were Bovie coagulated.  The platysma muscle was really nondescript because of an inflammatory mass and as I elevated this, I inserted a self-retaining retractor on a Gelpi.  At that point, I could palpate a mass and as I incised the mass there was a gross amount of purulence.  This was sent for anaerobic, aerobic culture as well as AFB and fungus.  I removed probably 50 or so ccs of purulence.  I felt that there was an inflammatory sac but I did not see any obvious tumor.  There was some inflammatory tissue at the base of the mass which I have sent for biopsy.  I could feel several pockets manually and further debrided purulence and at that point I did not see any further purulence or any tumor.  The bed was quite dry.  The wound was irrigated with saline solution.  A Jackson-Pratt drain inserted.  The wound was again irrigated with saline solution.  The subcutaneous closed with 3-0 Vicryl and the skin closed with running 3-0 subcuticular Prolene.  Steri-Strips were applied, then a bulky dressing.  PLAN:  I am going to admit him to [facility] [facility] [employer] [facility] for Infectious Disease Service consultation and place him on antibiotics pending culture. DD:  01/06/00 TD:  01/06/00 Job: [id number]

## 2010-11-11 NOTE — Discharge Summary (Signed)
NAME:  [name], [name]                 ACCOUNT NO.:  [id number]   MEDICAL RECORD NO.:  [id number]          PATIENT TYPE:  INP   LOCATION:  6523                         FACILITY:  [facility]   PHYSICIAN:  Mobolaji B. Bakare, M.D.DATE OF BIRTH:  January 27, 1941   DATE OF ADMISSION:  09/25/2004  DATE OF DISCHARGE:  10/08/2004                                 DISCHARGE SUMMARY   PRIMARY CARE PHYSICIAN:  [name], M.D.   CONSULTATIONS:  1.  Ulyses Amor, M.D. and Dr. Katrinka Blazing, cardiology.  2.  ID: Rockey Situ. Roxan Hockey, M.D.  3.  Nephrology:  Wilber Bihari. Caryn Section, M.D.  4.  Dr. Roselyn Meier, Neurosurgeon  5.  Danice Goltz, M.D. [facility], pulmonary and critical care.   FINAL DIAGNOSES:  1.  Septicemia, septic shock.  2.  Respiratory failure secondary to pulmonary edema.  3.  Acute on chronic renal failure from the cytopenia.  4.  Systemic inflammatory response.  5.  Abnormal troponin.  6.  Hyperlipidemia.  7.  Hypertension.  8.  Anemia.  9.  Acute sinusitis.  10. Hyponatremia.  11. Altered mental status.  12. Gout.  13. Community-acquired pneumonia.  14. Anemia of chronic disease.   CHIEF COMPLAINT:  Altered mental status.   BRIEF HISTORY:  Please refer to admission H&P.  In brief, [name] was a  previously healthy Caucasian male with stable medical illness.  four weeks  prior to his hospitalization, he had back surgery.  He was brought into the  hospital for altered mental status and hallucination.  On arrival in the  emergency department, he was found to have a temperature  of 103, white cell  was  elevated and CT scan compatible with acute sinusitis.  He was  tachycardic.  The patient was admitted for a workup and treatment.   PERTINENT PHYSICAL FINDINGS:  VITAL SIGNS:  Initial vitals on arrival,  temperature of 103.3, blood pressure 126/81.  It later dropped significantly  to 78/64, pulse of 128, respiratory rate of 22, O2 sats of 91% on room air.  GENERAL:  The patient was somewhat  drowsy but arousable and oriented x 3.  He was in acute respiratory distress  HEENT:  Unremarkable.  LUNGS:  Good air entry bilaterally.  No added sounds.  CVS:  The patient was tachycardic.  ABDOMEN:  Unremarkable.  EXTREMITIES:  Showed no edema, cyanosis, or clubbing.   INITIAL LABORATORY DATA:  Showed a white cell count of 20,000, hemoglobin of  12.3, platelets of 223.  Sodium was 129, potassium was 5.6, chloride 101,  BUN 60, glucose 120, bicarb of 25, creatinine was 3.0.  Urinalysis was  unremarkable.  Chest x-ray showed no acute findings.  Head CT scan without  contrast showed air/fluid levels in the maxillary sinus indicating acute  sinusitis.  EKG:  Sinus tachycardia with left fascicular block with marked Q  waves and nonspecific ST changes.   PROCEDURES:  1.  Chest x-ray:  Cardiomegaly with vascular congestion and increased      peribronchial markings.  No edema or consolidation.  2.  Head CT:  Marked  atrophy, bilateral maxillary sinus air/fluid levels      indicative of acute sinusitis, chronic mastoid inflammatory changes were      noted on the right.  3.  CT of the spine without contrast:  There was no worrisome postoperative      fluid collection suggestive of abscess.  4.  Fluoroscopic guided lumbar puncture.  5.  Right internal jugular venous access.  6.  CT abdomen and pelvis:  No evidence of acute intra-abdominal findings,      bilateral lower lobe air space filling.  There was diverticulosis noted      without diverticulitis.  7.  MRI lumbar spine:  No evidence of abscess or diskitis.  There was      evidence of recent postoperative changes.  8.  Ultrasound kidneys showed no evidence of hydronephrosis or proximal      hydroureter.  9.  Echocardiogram findings as follows:  Left ventricle, ejection fraction      was 30-40%, therefore, it is moderately decreased left ventricular      systolic function and severe hypokinesis of the mid distal      posterolateral  wall.  There was mild mitral valvular regurgiation.   HOSPITAL COURSE:  [name] had a prolonged and complicated hospital course.  He was admitted into the critical care unit and was evaluated by pulmonary  and critical care and initially treated aggressively for septic shock,  potential source was thought to be community-acquired pneumonia versus  lumbar diskitis, was empirically started on vancomycin, Zosyn, and  Zithromax.  He required inotropic support to maintain blood pressure in  addition to IV fluids.  He had a LP done under fluoroscopy and was not  suggestive of meningitis.  He was seen in consultation by neurosurgeon in  view of recent back surgery and potential source of infection being from the  surgery.  He eventually had a MRI which did not show evidence of lumbar  abscess or diskitis.  He was seen in consultation by infectious disease and  the thought was that he could possibly have had some pneumonia superimposed  on CHF, however, the possibility of post lumbar surgery infection could not  totally be excluded given the fact that the patient was started on empiric  antibiotic treatment prior to imaging studies.  Overall, the etiology of the  septic shock is unclear.  The patient improved with antibiotic treatment and  inotropic support was able to be weaned off.   The patient was seen in consultation by cardiology for elevated cardiac  enzymes in the setting of sepsis.  Troponin was 1.29, 0.57, and 0.72.  All  CK, CK-MB, relative indices were normal.  It was felt that this could be  explained by renal failure alone and there was no EKG signs of acute MI.  BNP was elevated in the setting of acute renal failure.  A 2D echocardiogram  was checked and this revealed an ejection fraction of 30-40% with left  ventricle systolic function moderately decreased.  There was severe  hypokinesis at the mid distal posterior lateral wall.  During the course of hospitalization, the patient  went into decompensated CHF and he was  aggressively diuresed.  BNP was quite high at 1,928.  [name] did not  require intubation, although he did wear Bi-PAP p.r.n.  Overall, it was felt  that the fever was due to pneumonia superimposed on congestive heart failure  and the altered mental status is a combination of infectious process and  narcotics.  He was transferred from the intensive care unit to medical  service on September 30, 2004.  He continued to improve on empiric antibiotic  treatment.   He had some diarrhea which was C. diff negative.  This improved  spontaneously.   He had a current episode of fever and eventually it was noted that he got  gout in the right big toe.  This responded to colchicine and prednisone.   Of note, is that creatinine worsened from 3.4 on admission to 3.9 with  improvement in creatinine in between.  A nephrology consult was obtained.  He had a negative ultrasound of the kidneys without hydronephrosis or  hydroureter.  It was believed that the worsening creatinine after a period  of improvement is due to dehydration and Lasix, hence the patient was  started on gentle IV fluid hydration.  His lungs remained stable .  Also  complicating the kidney failure is the setting of sepsis.   He was anemic and this was thought to be multifactorial, although he had a  positive occult blood on rectal examination.  This was repeated and it was  negative.  He does have a history of hemorrhoids.  Overall, [name] improved somewhat slowly and he was discharged home in a  stable condition alert, oriented x 3.  Vitals:  Blood pressure 140/70 and  saturating at 96% on room air, making adequate urine.  Urinary output ranged  between 1,500 to 1,150.   LABORATORY DATA:  At the time of discharge:  Sodium 136, potassium 3.8,  chloride 100, bicarb 27, BUN 28, creatinine 4.2, glucose 95.  White cell  11.3, hematocrit 26.8, hemoglobin 9.5, platelets 487.   He was discharged  home on the following medications:  1.  Aspirin 325 mg once a day.  2.  Coreg 12.5 mg two times a day.  3.  Colchicine 0.6 mg once a day.  4.  Aranesp 200 mcg subcu once a week.  5.  Colace 100 mg two times a day.  6.  Fentanyl patch 12.5 mcg every 32 hours.  7.  Nasonex nasal spray once a day.  8.  Prednisone.  9.  Trazodone 50 mg at bedtime.  10. Pravachol.   DIET:  Nutritional supplemented Ensure four times a day.   SPECIAL INSTRUCTIONS:  To do B-MET two times a week and follow up with Dr.  Rudi Heap.   FOLLOW UP:  Follow up with Dr. Christell Constant in one week and Dr. Caryn Section in 3-4 weeks.  The patient is to confirm appointment.   RECOMMENDATIONS:  Repeat 2D echo in six months to re-evaluate the left  ventricular function.      MBB/MEDQ  D:  10/26/2004  T:  10/26/2004  Job:  [id number]   cc:   [name], M.D.  [address] [place]  [place] [address]  Fax: [phone]   Ulyses Amor, MD  [address] [place], [place] [address]  Fax: [phone]   Rockey Situ. Flavia Shipper., M.D.  [address]  [place]  [place] [address]  Fax: [phone]   Wilber Bihari. Caryn Section, M.D.  [address]  [place]  [place] [address]  Fax: [phone]

## 2010-11-11 NOTE — H&P (Signed)
[name], [name]                 ACCOUNT NO.:  [id number]   MEDICAL RECORD NO.:  [id number]          PATIENT TYPE:  INP   LOCATION:  1824                         FACILITY:  [facility]   PHYSICIAN:  Lonia Blood, M.D.      DATE OF BIRTH:  01-22-1941   DATE OF ADMISSION:  09/25/2004  DATE OF DISCHARGE:                                HISTORY & PHYSICAL   PRIMARY CARE PHYSICIAN:  Dr. Vernon Prey   CARDIOLOGIST:  Dr. Verdis Prime   SURGEON:  Cristi Loron, M.D.   PRESENTING COMPLAINT:  Altered mental status change.   HISTORY OF PRESENT ILLNESS:  This is a 70 year old white male with history  of coronary artery disease status post CABG in 1992 and degenerative disk  disease status post back surgery on August 25, 2004 who was brought in by his  wife secondary to altered mental status change.  Per wife patient's mental  status had started deteriorating since he started a new medication (Lyrica)  about a week ago prescribed by Dr. Lovell Sheehan.  She noticed that he has been  increasingly sleepy, sleeping sometimes up to 18 hours a day.  Today  however, she was driving him and her to go visit their children when  suddenly patient started having some hallucinations.  His mental status also  changed according to the wife, mainly patient was deeply asleep.  She was  unable to keep him awake.  She was so worried therefore that she initially  called Dr. Lovell Sheehan and then brought the patient to the emergency room.  Patient is currently awake, alert and oriented x3.  He is able to carry on  conversations well.  He however, drifts a little bit back into sleep when  conversation stops.  He is able to give adequate history.  Patient complains  of having had some cough and upper respiratory symptoms for the past 3 days.  He specifically said he feels congested.  Denied any chest pain.  Denied any  headaches.  Patient said he was aware partly of the some of the incidents  that happened this morning as described  by his wife.   PAST MEDICAL HISTORY:  His past medical history includes coronary artery  disease status post CABG in 1992 which has been stable now.  Degenerative  disk disease; patient just had surgery on August 25, 2004 by Dr. Lovell Sheehan;  surgery includes right L3-4 lateral herniated disk and he had lateral  microdiscectomy with microdissection.  History of dyslipidemia.  Chronic  kidney disease secondary to NSAID use.  Patient had some glomerulonephritis  according to wife.   ALLERGIES:  Patient is allergic to NSAIDS.   MEDICATIONS:  1.  Lyrica 50 mg p.o. t.i.d.  2.  Actos 15 mg p.o. once daily for cholesterol according to wife.  3.  OxyContin 20 mg p.o. b.i.d.  4.  Lisinopril/hydrochlorothiazide 20/12.5 mg once daily.  5.  Trazodone 150 mg once daily.  6.  Coreg 6.25 mg every morning, 12.5 mg every evening - change today to      12.5 mg every morning and  25 mg every evening.  7.  Niaspan 2000 mg at bedtime.  8.  Pravachol 80 mg once daily.  9.  Restoril 30 mg at bedtime for sleep.  10. Aspirin 325 mg once daily.  11. Multivitamin daily.   SOCIAL HISTORY:  Patient lives with his wife at [place].  Denied any  tobacco use or alcohol use.   FAMILY HISTORY:  Mainly hypertension and coronary artery disease.   REVIEW OF SYSTEMS:  GENERAL:  Patient denied any weight gain/weight loss.  Patient did not know if he has had much fever, however, he has had some  slight chills.  HEENT:  As in HPI patient complained of stuffiness of his  nose and feeling congested in sinuses.  Denied any headaches.  RESPIRATORY:  Denied any chest pain, he has had some cough on and off.  CARDIOVASCULAR:  Denied any chest pain, PND, orthopnea.  ABDOMEN:  Denied any nausea,  vomiting or diarrhea.  EXTREMITIES:  Denied any swelling, pain or muscle  aches.   EXAMINATION:  VITAL SIGNS:  Temperature was 103.3, blood pressure initially  136/81 mild drop to 94/58 and 78/64.  His pulse is 128, respiratory rate  22,  saturations 91% on room air.  GENERAL:  Patient is alert, oriented x3, able to carry on with conversation.  He is in no acute distress however, patient looks sleepy in between  conversation.  HEENT:  PERRL.  EOMI.  His neck is supple.  No JVD, no lymphadenopathy.  RESPIRATORY:  Patient has good air entry bilaterally.  No wheezes or rales.  CARDIOVASCULAR SYSTEM:  Patient is tachycardic.  ABDOMEN:  His abdomen is obese, nontender, with positive bowel sounds.  EXTREMITIES:  His extremities show no edema, cyanosis or clubbing.   LABORATORIES:  White count of 20,000, hemoglobin 12.3, platelets 223.  Sodium is 129, potassium 5.6, chloride 101, BUN 60, glucose 120, bicarb of  25, creatinine 3.  UA essentially negative.  Patient also has a left shift  with ANC of 18.  His chest x-ray showed nothing acute.  Head CT without  contrast was negative for acute however, showed evidence of air-fluid levels  in the maxillary sinus indicating acute sinusitis possibly.  His EKG shows  sinus tachycardia with some left anterior fascicular block.  Patient has  what appears to be some mild Q waves but nonspecific ST changes.  No old EKG  to compare.   ASSESSMENT:  1.  This is a 70 year old with history of coronary artery disease status      post recent surgery, on tons of medication at least four groups of      medication that could sedate individual.  He is presenting with altered      mental status mainly appearing too sleepy.  Based on the history it      seems like symptoms started after he started taking the new drug - the      Lyrica - and they have been escalating the dose.  Of worrisome to me      however, is that patient has white count, fever and altered mental      status changes.  Meningitis comes to mind although after admitting the      patient and examining the patient it sounds less likely.  Patient is     alert, oriented, carrying on with communications effectively.  Chances      are  he did have a sinusitis which may explain the fever and the  white      count.  In any event, I am treating the patient initially with Rocephin      at this point.  He has had nausea, with no vomiting.  We may need to do      a lumbar puncture.  However, patient's lumbar puncture will need to be      done under fluoroscopy especially since he just had back surgery in the      lumbar region about 4 weeks ago.  For his altered mental status change,      I will admit the patient for close observation.  I will hold all      potential sedatives and observe how patient does.  We will observe him      and see if he improves then probably it was effect of the drugs that he      is now gradually clearer.  2.  For his back pain, suspicion has been on hefty dose of OxyContin, I will      try and give patient intravenous morphine or Dilaudid as needed.  We      could escalate the dose and probably restart the oral medications as      needed.  3.  For tachycardia, this may be a result of combination of probably      dehydration and the pain.  At this point however, due to patient's      history of coronary artery disease I will go ahead and check cardiac      enzymes.  I will start him back on his beta blocker after I give him      some intravenous Lopressor.  If there is any significant change, I will      consider cardiac consult.  Of note, patient has been dropping his      pressures it looks like up into the 70s.  I will check a BNP to make      sure patient is not in congestive heart failure.  I will start dopamine      or dobutamine depending on the circumstances to boost his pressure.  Per      wife patient's blood pressure normally runs in the 90s.  If pressure      becomes an issue as indicated, I will consult Dr. Katrinka Blazing.  4.  Dyslipidemia.  I will continue patient on his Pravachol.  Per wife he is      getting Actos for his cholesterol; he has no diabetes.  I will hold off      the Actos at  this time.  5.  Hypertension.  Patient is taking hydrochlorothiazide/lisinopril but his      blood pressure is still low now so I am going to hold oral      antihypertensive.  6.  Chronic kidney disease.  Patient's creatinine is 3 and BUN 60 but he has      chronic kidney disease.  I cannot find his last BUN and creatinine but      will try and find out what his baseline is.  At this time however, I      will check the BNP first to make sure patient is not in any kind of      failure and then consider hydrating the patient maybe that will help      improve his kidney function.      LG/MEDQ  D:  09/25/2004  T:  09/25/2004  Job:  [id number]   cc:   [name], M.D.  [address] [place]  [place] [address]  Fax: [phone]   Lyn Records III, M.D.  [address]  [place]  [place] [address]  Fax: [phone]   Cristi Loron, M.D.  [address]  [place]  [place] [address] Fax: [phone]

## 2010-11-11 NOTE — Discharge Summary (Signed)
[place]  Patient:    [name], [name]                        MRN: [id number] Adm. Date:  [id number] Disc. Date: [id number] Attending:  Randolm Idol Dictator:   Jamelle Rushing, P.A.                           Discharge Summary  ADMITTING DIAGNOSIS:  Abscess right shoulder.  DISCHARGE DIAGNOSIS:  Status post excision right shoulder abscess.  HISTORY OF PRESENT ILLNESS:  The patient is a 70 year old gentleman who is approximately five months status post excision of distal right clavicle.  The patient is having considerable pain.  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  The patient was initially watched over a period of time.  The patient was able to return to his normal activities such as playing golf.  Over the last four to six weeks, the patient had had increased pain on the right side of his neck.  This had reached the point where it was most uncomfortable, and he had low-grade temperatures.  His white cell count was 11,000 and elevated sed rate of 94.  An MRI of his neck revealed a cystic area possibly hemorrhagic or purulent, or possibly even a tumor.  The patient is now to have a biopsy and excision and drainage of the area.  SURGICAL PROCEDURE:  On January 06, 2000, the patient had a biopsy and excision and drainage of his abscess of his right neck and shoulder at the [facility].  After finding that greater than 50 cc of purulent material was drained from the abscess, the patient was transferred over to the [facility] for infectious disease evaluation and to be placed on antibiotic. The patient incurred no complications during the procedure.  The patient was transferred to [facility] without any further complications.  CONSULTS:  An infectious disease consult was requested and was completed.  HOSPITAL COURSE:  The  patient incurred a three day hospital course of IV antibiotics and infectious disease evaluation.  The patient had a staph aureus that was cultured, and it was found to be sensitive to most antibiotics.  The patient was covered with vancomycin initially and then transferred home on Tequin 400 mg a day.  The patient was also discharged with rifampin 300 mg p.o. b.i.d.  This coverage was to be for at least four weeks per Dr. Orvan Falconer.  DISCHARGE MEDICATIONS: 1. Microzide 12.5 mg p.o. q.d. 2. Altace 2.5 mg p.o. b.i.d. 3. Zocor 10 mg p.o. q.d. 4. Tequin 400 mg q.24h. 5. Percocet 1-2 tablets q.4-6h. p.r.n. pain.  CONDITION ON DISCHARGE:  Improved. DD:  02/04/00 TD:  02/05/00 Job: [id number]

## 2010-11-24 ENCOUNTER — Other Ambulatory Visit: Payer: Self-pay | Admitting: Internal Medicine

## 2010-11-24 ENCOUNTER — Ambulatory Visit (INDEPENDENT_AMBULATORY_CARE_PROVIDER_SITE_OTHER): Payer: Medicare Other | Admitting: *Deleted

## 2010-11-24 DIAGNOSIS — I428 Other cardiomyopathies: Secondary | ICD-10-CM

## 2010-11-30 ENCOUNTER — Other Ambulatory Visit: Payer: Self-pay | Admitting: Dermatology

## 2010-11-30 NOTE — Progress Notes (Signed)
ICD REMOTE  

## 2010-12-16 ENCOUNTER — Encounter: Payer: Self-pay | Admitting: *Deleted

## 2011-02-23 ENCOUNTER — Encounter: Payer: Self-pay | Admitting: Internal Medicine

## 2011-02-23 ENCOUNTER — Ambulatory Visit (INDEPENDENT_AMBULATORY_CARE_PROVIDER_SITE_OTHER): Payer: Medicare Other | Admitting: *Deleted

## 2011-02-23 DIAGNOSIS — I428 Other cardiomyopathies: Secondary | ICD-10-CM

## 2011-03-10 NOTE — Progress Notes (Signed)
ICD checked by remote. 

## 2011-04-10 ENCOUNTER — Encounter: Payer: Self-pay | Admitting: Internal Medicine

## 2011-04-25 ENCOUNTER — Encounter: Payer: Self-pay | Admitting: Internal Medicine

## 2011-04-25 ENCOUNTER — Ambulatory Visit (INDEPENDENT_AMBULATORY_CARE_PROVIDER_SITE_OTHER): Payer: Medicare Other | Admitting: Internal Medicine

## 2011-04-25 DIAGNOSIS — I25810 Atherosclerosis of coronary artery bypass graft(s) without angina pectoris: Secondary | ICD-10-CM

## 2011-04-25 DIAGNOSIS — I493 Ventricular premature depolarization: Secondary | ICD-10-CM

## 2011-04-25 DIAGNOSIS — I4949 Other premature depolarization: Secondary | ICD-10-CM

## 2011-04-25 DIAGNOSIS — I509 Heart failure, unspecified: Secondary | ICD-10-CM

## 2011-04-25 DIAGNOSIS — Z95810 Presence of automatic (implantable) cardiac defibrillator: Secondary | ICD-10-CM

## 2011-04-25 DIAGNOSIS — I2589 Other forms of chronic ischemic heart disease: Secondary | ICD-10-CM

## 2011-04-25 NOTE — Assessment & Plan Note (Signed)
He denies anginal symptoms. Continue his current medical therapy and remained active exercising on a regular basis.

## 2011-04-25 NOTE — Assessment & Plan Note (Signed)
His chronic systolic heart failure is well controlled. He'll continue his current medical therapy and maintain a low-sodium diet.

## 2011-04-25 NOTE — Assessment & Plan Note (Signed)
His device is working normally. Planned recheck in several months. 

## 2011-04-25 NOTE — Patient Instructions (Signed)
Your physician wants you to follow-up in: 12 months with Dr Taylor You will receive a reminder letter in the mail two months in advance. If you don't receive a letter, please call our office to schedule the follow-up appointment.  Remote monitoring is used to monitor your Pacemaker of ICD from home. This monitoring reduces the number of office visits required to check your device to one time per year. It allows us to keep an eye on the functioning of your device to ensure it is working properly. You are scheduled for a device check from home on 07/27/2011. You may send your transmission at any time that day. If you have a wireless device, the transmission will be sent automatically. After your physician reviews your transmission, you will receive a postcard with your next transmission date.   

## 2011-04-25 NOTE — Progress Notes (Signed)
HPI [name] returns today for followup. He is a very pleasant 70 year old man with a history of coronary artery disease status post MI, and ischemic cardiomyopathy, chronic systolic heart failure, right bundle branch block, atrial tachycardia, status post ICD implantation. He has done well in the interim. His heart failure there is some class I and class II. He remains active. He denies chest pain or shortness of breath and has had no peripheral edema. No additional complaints today. He has had no ICD shocks. Allergies  Allergen Reactions  . Nsaids Other (See Comments)    CKD  . Ibuprofen   . Lyrica   . Oxycontin   . Pregabalin      Current Outpatient Prescriptions  Medication Sig Dispense Refill  . allopurinol (ZYLOPRIM) 100 MG tablet Take 100 mg by mouth daily.        Marland Kitchen aspirin EC 325 MG EC tablet Take 325 mg by mouth daily after supper.        . carvedilol (COREG) 12.5 MG tablet Take 12.5 mg by mouth 2 (two) times daily with a meal.        . cholecalciferol (VITAMIN D) 1000 UNITS tablet Take 1,000 Units by mouth daily.        Marland Kitchen docusate sodium (COLACE) 50 MG capsule Take by mouth daily.        Marland Kitchen ezetimibe (ZETIA) 10 MG tablet Take 5 mg by mouth every other day.        . fish oil-omega-3 fatty acids 1000 MG capsule Take 2 g by mouth daily.        Marland Kitchen lisinopril (PRINIVIL,ZESTRIL) 10 MG tablet Take 10 mg by mouth daily.        . multivitamin (THERAGRAN) per tablet Take 1 tablet by mouth daily.        . niacin (NIASPAN) 1000 MG CR tablet Take 1,500 mg by mouth at bedtime.       . pravastatin (PRAVACHOL) 80 MG tablet Take 80 mg by mouth at bedtime.        . temazepam (RESTORIL) 30 MG capsule Take 30 mg by mouth at bedtime as needed.        . traZODone (DESYREL) 50 MG tablet Take 50 mg by mouth at bedtime.           Past Medical History  Diagnosis Date  . ASCVD (arteriosclerotic cardiovascular disease)   . MI, old   . Other and unspecified hyperlipidemia   . Hx of CABG   . Essential  hypertension, benign   . Gastroesophageal reflux disease with hiatal hernia   . Osteoarthrosis and allied disorders   . Impotence   . Hemorrhoids   . Colon polyps   . PVC's (premature ventricular contractions)     ROS:   All systems reviewed and negative except as noted in the HPI.   Past Surgical History  Procedure Date  . Coronary artery bypass graft 1993  . Tonsillectomy and adenoidectomy   . Left knee surgery   . Rt shoulder surgery   . Lumbar spine surgery march 2006     Family History  Problem Relation Age of Onset  . Hypertension Mother 66  . Prostate cancer Father 51     History   Social History  . Marital Status: Married    Spouse Name: 4    Number of Children: N/A  . Years of Education: N/A   Occupational History  . RETIRED    Social History Main Topics  .  Smoking status: Former Smoker    Types: Cigarettes  . Smokeless tobacco: Not on file  . Alcohol Use: Yes  . Drug Use: No  . Sexually Active: Yes   Other Topics Concern  . 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  He is married and lives with his wife of 31 years.  He has four daughters and 11 grandchildren all of which are healthy.  He denies any drug or alcohol use. Smoking cessation occurred five years ago.     BP 124/86  Pulse 65  Ht 5\' 10"  (1.778 m)  Wt 192 lb (87.091 kg)  BMI 27.55 kg/m2  Physical Exam:  Well appearing  70 year old man,NAD HEENT: Unremarkable Neck:  No JVD, no thyromegally Lymphatics:  No adenopathy Back:  No CVA tenderness Lungs:  Clear with no wheezes, rales, or rhonchi. Well-healed ICD incision. HEART:  Regular rate rhythm, no murmurs, no rubs, no clicks Abd:  soft, positive bowel sounds, no organomegally, no rebound, no guarding Ext:  2 plus pulses, no edema, no cyanosis, no clubbing Skin:  No rashes no nodules Neuro:  CN II through XII  intact, motor grossly intact  DEVICE  Normal device function.  See PaceArt for details.   Assess/Plan:

## 2011-04-26 LAB — ICD DEVICE OBSERVATION
DEVICE MODEL ICD: 752547
FVT: 0
HV IMPEDENCE: 40 Ohm
MODE SWITCH EPISODES: 0
PACEART VT: 0
RV LEAD AMPLITUDE: 11.3 mv
RV LEAD THRESHOLD: 1 V
TOT-0006: 20101130000000
TOT-0009: 1
TOT-0010: 6
TZAT-0013SLOWVT: 2
TZAT-0018SLOWVT: NEGATIVE
TZAT-0019SLOWVT: 7.5 V
TZAT-0020SLOWVT: 1 ms
TZON-0003SLOWVT: 325 ms
TZON-0004SLOWVT: 16
TZON-0005SLOWVT: 6
TZON-0010SLOWVT: 80 ms
TZST-0001SLOWVT: 2
TZST-0003SLOWVT: 40 J

## 2011-06-05 ENCOUNTER — Other Ambulatory Visit: Payer: Self-pay | Admitting: Cardiology

## 2011-07-27 ENCOUNTER — Ambulatory Visit (INDEPENDENT_AMBULATORY_CARE_PROVIDER_SITE_OTHER): Payer: Medicare Other | Admitting: *Deleted

## 2011-07-27 DIAGNOSIS — I428 Other cardiomyopathies: Secondary | ICD-10-CM

## 2011-07-27 DIAGNOSIS — Z95810 Presence of automatic (implantable) cardiac defibrillator: Secondary | ICD-10-CM

## 2011-07-28 ENCOUNTER — Encounter: Payer: Self-pay | Admitting: Internal Medicine

## 2011-07-28 LAB — REMOTE ICD DEVICE
AL AMPLITUDE: 2.2 mv
AL IMPEDENCE ICD: 440 Ohm
DEVICE MODEL ICD: 752547
RV LEAD AMPLITUDE: 11.3 mv
RV LEAD IMPEDENCE ICD: 440 Ohm
TZAT-0004SLOWVT: 8
TZAT-0013SLOWVT: 2
TZAT-0018SLOWVT: NEGATIVE
TZAT-0019SLOWVT: 7.5 V
TZON-0003SLOWVT: 325 ms
TZON-0004SLOWVT: 16
TZON-0010SLOWVT: 80 ms
TZST-0001SLOWVT: 3
TZST-0003SLOWVT: 40 J

## 2011-08-02 ENCOUNTER — Encounter: Payer: Self-pay | Admitting: *Deleted

## 2011-08-02 NOTE — Progress Notes (Signed)
Remote defib check  

## 2011-09-13 ENCOUNTER — Encounter: Payer: Self-pay | Admitting: Internal Medicine

## 2011-10-23 ENCOUNTER — Encounter: Payer: Self-pay | Admitting: Internal Medicine

## 2011-10-26 ENCOUNTER — Encounter: Payer: Medicare Other | Admitting: *Deleted

## 2011-10-31 ENCOUNTER — Encounter: Payer: Self-pay | Admitting: *Deleted

## 2011-11-03 ENCOUNTER — Encounter: Payer: Self-pay | Admitting: Internal Medicine

## 2011-11-03 ENCOUNTER — Ambulatory Visit (INDEPENDENT_AMBULATORY_CARE_PROVIDER_SITE_OTHER): Payer: Medicare Other | Admitting: *Deleted

## 2011-11-03 DIAGNOSIS — I509 Heart failure, unspecified: Secondary | ICD-10-CM

## 2011-11-03 DIAGNOSIS — I428 Other cardiomyopathies: Secondary | ICD-10-CM

## 2011-11-03 LAB — REMOTE ICD DEVICE
AL AMPLITUDE: 2.4 mv
ATRIAL PACING ICD: 1 pct
BAMS-0001: 150 {beats}/min
DEVICE MODEL ICD: 752547
RV LEAD AMPLITUDE: 11.3 mv
TZAT-0004SLOWVT: 8
TZAT-0012SLOWVT: 200 ms
TZAT-0013SLOWVT: 2
TZST-0001SLOWVT: 2
TZST-0001SLOWVT: 3
TZST-0003SLOWVT: 40 J

## 2011-11-10 ENCOUNTER — Encounter: Payer: Self-pay | Admitting: *Deleted

## 2011-11-13 NOTE — Progress Notes (Signed)
ICD remote 

## 2012-02-08 ENCOUNTER — Ambulatory Visit (INDEPENDENT_AMBULATORY_CARE_PROVIDER_SITE_OTHER): Payer: Medicare Other | Admitting: *Deleted

## 2012-02-08 DIAGNOSIS — Z95810 Presence of automatic (implantable) cardiac defibrillator: Secondary | ICD-10-CM

## 2012-02-08 DIAGNOSIS — I509 Heart failure, unspecified: Secondary | ICD-10-CM

## 2012-02-09 ENCOUNTER — Encounter: Payer: Self-pay | Admitting: Internal Medicine

## 2012-02-09 LAB — REMOTE ICD DEVICE
BAMS-0001: 150 {beats}/min
DEVICE MODEL ICD: 752547
HV IMPEDENCE: 44 Ohm
TZAT-0012SLOWVT: 200 ms
TZAT-0013SLOWVT: 2
TZAT-0018SLOWVT: NEGATIVE
TZAT-0019SLOWVT: 7.5 V
TZAT-0020SLOWVT: 1 ms
TZON-0003SLOWVT: 325 ms
TZON-0004SLOWVT: 16
TZON-0005SLOWVT: 6
TZST-0001SLOWVT: 2
TZST-0001SLOWVT: 4
TZST-0003SLOWVT: 40 J

## 2012-02-16 ENCOUNTER — Encounter: Payer: Self-pay | Admitting: *Deleted

## 2012-04-02 ENCOUNTER — Encounter: Payer: Self-pay | Admitting: Internal Medicine

## 2012-04-10 ENCOUNTER — Encounter: Payer: Self-pay | Admitting: *Deleted

## 2012-04-16 ENCOUNTER — Encounter: Payer: Self-pay | Admitting: Internal Medicine

## 2012-04-16 ENCOUNTER — Ambulatory Visit (INDEPENDENT_AMBULATORY_CARE_PROVIDER_SITE_OTHER): Payer: Medicare Other | Admitting: Internal Medicine

## 2012-04-16 ENCOUNTER — Other Ambulatory Visit: Payer: Self-pay | Admitting: *Deleted

## 2012-04-16 VITALS — BP 120/78 | HR 54 | Ht 70.0 in | Wt 189.4 lb

## 2012-04-16 DIAGNOSIS — Z951 Presence of aortocoronary bypass graft: Secondary | ICD-10-CM

## 2012-04-16 DIAGNOSIS — I25810 Atherosclerosis of coronary artery bypass graft(s) without angina pectoris: Secondary | ICD-10-CM

## 2012-04-16 DIAGNOSIS — I509 Heart failure, unspecified: Secondary | ICD-10-CM

## 2012-04-16 DIAGNOSIS — I2589 Other forms of chronic ischemic heart disease: Secondary | ICD-10-CM

## 2012-04-16 DIAGNOSIS — Z7901 Long term (current) use of anticoagulants: Secondary | ICD-10-CM

## 2012-04-16 DIAGNOSIS — I4949 Other premature depolarization: Secondary | ICD-10-CM

## 2012-04-16 DIAGNOSIS — Z01818 Encounter for other preprocedural examination: Secondary | ICD-10-CM

## 2012-04-16 DIAGNOSIS — T827XXA Infection and inflammatory reaction due to other cardiac and vascular devices, implants and grafts, initial encounter: Secondary | ICD-10-CM

## 2012-04-16 DIAGNOSIS — I493 Ventricular premature depolarization: Secondary | ICD-10-CM

## 2012-04-16 LAB — ICD DEVICE OBSERVATION
AL AMPLITUDE: 2.6 mv
AL IMPEDENCE ICD: 400 Ohm
ATRIAL PACING ICD: 0 pct
BRDY-0002RV: 40 {beats}/min
BRDY-0004RV: 120 {beats}/min
CHARGE TIME: 9.5 s
MODE SWITCH EPISODES: 0
RV LEAD AMPLITUDE: 11.3 mv
TOT-0007: 1
TZAT-0001SLOWVT: 1
TZAT-0019SLOWVT: 7.5 V
TZAT-0020SLOWVT: 1 ms
TZON-0004SLOWVT: 16
TZON-0005SLOWVT: 6
TZON-0010SLOWVT: 80 ms
TZST-0001SLOWVT: 2
TZST-0001SLOWVT: 4
TZST-0003SLOWVT: 30 J
VENTRICULAR PACING ICD: 1 pct
VF: 0

## 2012-04-16 NOTE — Assessment & Plan Note (Signed)
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 He is not systemically ill. I've recommended scheduling him to undergo device and lead extraction in the next few weeks. No antibiotic therapy will be given. I've informed the patient that should he develop fevers or chills, but he is to come to the emergency room.

## 2012-04-16 NOTE — Assessment & Plan Note (Signed)
Electrocardiogram from his primary care doctor's office demonstrates sinus rhythm with PVCs in a bigeminal fashion. Fortunately, he is asymptomatic. If his PVCs persist, he will need additional treatment. We'll address this issue once his defibrillator has been extracted.

## 2012-04-16 NOTE — Progress Notes (Signed)
HPI [name] returns today for followup. He is a very pleasant 70-year-old man who underwent insertion of a biventricular ICD approximately 3 years ago. Postoperatively, the patient developed atrial tachycardia which was successfully ablated at the AV node. Fortunately, he did not develop heart block. Patient is an avid golfer in approximately one year after surgery, developed a late lead dislodgment since that his left ventricular lead was not functioning correctly. His device was reprogrammed and he has done well. I saw the patient several months ago and he was noted to have palpitations and frequent PVCs but was largely asymptomatic. He denies fevers or chills. Over the last several weeks, he has noted swelling and discomfort in his ICD insertion site. He notes the skin in the lower portion has become red and 10. In the upper portion, the pocket is full though not fluctuant. He otherwise feels well. Allergies  Allergen Reactions  . Nsaids Other (See Comments)    CKD  . Ibuprofen   . Oxycodone Hcl Er   . Pregabalin   . Pregabalin      Current Outpatient Prescriptions  Medication Sig Dispense Refill  . allopurinol (ZYLOPRIM) 100 MG tablet Take 100 mg by mouth daily.        . aspirin EC 325 MG EC tablet Take 325 mg by mouth daily after supper.        . cholecalciferol (VITAMIN D) 1000 UNITS tablet Take 1,000 Units by mouth daily.        . COREG 12.5 MG tablet TAKE  (1)  TABLET TWICE A DAY.  180 each  3  . docusate sodium (COLACE) 50 MG capsule Take by mouth daily.        . ezetimibe (ZETIA) 10 MG tablet Take 5 mg by mouth every other day.        . fish oil-omega-3 fatty acids 1000 MG capsule Take 1 g by mouth daily.       . lisinopril (PRINIVIL,ZESTRIL) 10 MG tablet Take 10 mg by mouth daily.        . multivitamin (THERAGRAN) per tablet Take 1 tablet by mouth daily.        . niacin (NIASPAN) 1000 MG CR tablet Take 1,500 mg by mouth at bedtime.       . pravastatin (PRAVACHOL) 80 MG tablet  Take 80 mg by mouth at bedtime.        . temazepam (RESTORIL) 30 MG capsule Take 30 mg by mouth at bedtime as needed.        . traZODone (DESYREL) 50 MG tablet Take 50 mg by mouth at bedtime.           Past Medical History  Diagnosis Date  . ASCVD (arteriosclerotic cardiovascular disease)   . MI, old   . Other and unspecified hyperlipidemia   . Hx of CABG   . Essential hypertension, benign   . Gastroesophageal reflux disease with hiatal hernia   . Osteoarthrosis and allied disorders   . Impotence   . Hemorrhoids   . Colon polyps   . PVC's (premature ventricular contractions)     ROS:   All systems reviewed and negative except as noted in the HPI.   Past Surgical History  Procedure Date  . Coronary artery bypass graft 1993  . Tonsillectomy and adenoidectomy   . Left knee surgery   . Rt shoulder surgery   . Lumbar spine surgery march 2006     Family History  Problem Relation Age of Onset  .   Hypertension Mother 92  . Prostate cancer Father 64     History   Social History  . Marital Status: Married    Spouse Name: 4    Number of Children: N/A  . Years of Education: N/A   Occupational History  . RETIRED    Social History Main Topics  . Smoking status: Former Smoker    Types: Cigarettes  . Smokeless tobacco: Not on file  . Alcohol Use: Yes  . Drug Use: No  . Sexually Active: Yes   Other Topics Concern  . 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  He is married and lives with his wife of 31 years.  He has four daughters and 11 grandchildren all of which are healthy.  He denies any drug or alcohol use. Smoking cessation occurred five years ago.     BP 120/78  Pulse 54  Ht 5' 10" (1.778 m)  Wt 189 lb 6.4 oz (85.911 kg)  BMI 27.18 kg/m2  SpO2 97%  Physical Exam:  Well appearing 70-year-old man, NAD HEENT: Unremarkable Neck:  No  JVD, no thyromegally Lungs:  Clear with no wheezes, rales, or rhonchi. ICD pocket is thinned and erythematous at the left lateral border. There is fullness over the pocket. HEART:  Regular rate rhythm, no murmurs, no rubs, no clicks Abd:  soft, positive bowel sounds, no organomegally, no rebound, no guarding Ext:  2 plus pulses, no edema, no cyanosis, no clubbing Skin:  No rashes no nodules Neuro:  CN II through XII intact, motor grossly intact  DEVICE  Normal device function.  See PaceArt for details.   Assess/Plan:  

## 2012-04-16 NOTE — Patient Instructions (Addendum)
Your physician recommends that you return for lab work in: April 29, 2012  Your physician recommends that you continue on your current medications as directed. Please refer to the Current Medication list given to you today.  Your physician recommends that you have a lead extraction

## 2012-04-16 NOTE — Assessment & Plan Note (Signed)
The patient denies any anginal symptoms. He remains active. He will continue his current medical therapy.

## 2012-04-24 ENCOUNTER — Encounter: Payer: Self-pay | Admitting: Internal Medicine

## 2012-04-29 ENCOUNTER — Ambulatory Visit (INDEPENDENT_AMBULATORY_CARE_PROVIDER_SITE_OTHER)
Admission: RE | Admit: 2012-04-29 | Discharge: 2012-04-29 | Disposition: A | Discharge status: Home / Self Care | Payer: Medicare Other | Source: Ambulatory Visit | Attending: Internal Medicine | Admitting: Internal Medicine

## 2012-04-29 ENCOUNTER — Other Ambulatory Visit (INDEPENDENT_AMBULATORY_CARE_PROVIDER_SITE_OTHER): Payer: Medicare Other

## 2012-04-29 DIAGNOSIS — Z01818 Encounter for other preprocedural examination: Secondary | ICD-10-CM

## 2012-04-29 DIAGNOSIS — Z7901 Long term (current) use of anticoagulants: Secondary | ICD-10-CM

## 2012-04-29 DIAGNOSIS — I509 Heart failure, unspecified: Secondary | ICD-10-CM

## 2012-04-29 DIAGNOSIS — Z951 Presence of aortocoronary bypass graft: Secondary | ICD-10-CM

## 2012-04-29 LAB — CBC WITH DIFFERENTIAL/PLATELET
Basophils Absolute: 0 10*3/uL (ref 0.0–0.1)
Basophils Relative: 0.4 % (ref 0.0–3.0)
Eosinophils Absolute: 0 10*3/uL (ref 0.0–0.7)
Eosinophils Relative: 0 % (ref 0.0–5.0)
HCT: 40.8 % (ref 39.0–52.0)
Hemoglobin: 13.2 g/dL (ref 13.0–17.0)
Lymphocytes Relative: 34.1 % (ref 12.0–46.0)
Lymphs Abs: 2 10*3/uL (ref 0.7–4.0)
MCHC: 32.3 g/dL (ref 30.0–36.0)
MCV: 98.7 fl (ref 78.0–100.0)
Monocytes Absolute: 0.4 10*3/uL (ref 0.1–1.0)
Monocytes Relative: 6.9 % (ref 3.0–12.0)
Neutro Abs: 3.4 10*3/uL (ref 1.4–7.7)
Neutrophils Relative %: 58.6 % (ref 43.0–77.0)
Platelets: 152 10*3/uL (ref 150.0–400.0)
RBC: 4.14 Mil/uL — ABNORMAL LOW (ref 4.22–5.81)
RDW: 16.2 % — ABNORMAL HIGH (ref 11.5–14.6)
WBC: 5.8 10*3/uL (ref 4.5–10.5)

## 2012-04-29 LAB — BASIC METABOLIC PANEL
BUN: 20 mg/dL (ref 6–23)
CO2: 30 mEq/L (ref 19–32)
Calcium: 9.4 mg/dL (ref 8.4–10.5)
Glucose, Bld: 108 mg/dL — ABNORMAL HIGH (ref 70–99)
Potassium: 4.4 mEq/L (ref 3.5–5.1)
Sodium: 140 mEq/L (ref 135–145)

## 2012-04-29 LAB — PROTIME-INR: INR: 1.1 ratio — ABNORMAL HIGH (ref 0.8–1.0)

## 2012-04-30 ENCOUNTER — Encounter (HOSPITAL_COMMUNITY): Payer: Self-pay | Admitting: Pharmacist

## 2012-05-01 ENCOUNTER — Encounter (HOSPITAL_COMMUNITY): Payer: Self-pay | Admitting: *Deleted

## 2012-05-01 MED ORDER — SODIUM CHLORIDE 0.9 % IR SOLN
80.0000 mg | Status: DC
  Filled 2012-05-01: qty 2

## 2012-05-01 MED ORDER — CEFAZOLIN SODIUM-DEXTROSE 2-3 GM-% IV SOLR
2.0000 g | INTRAVENOUS | Status: AC
  Administered 2012-05-02: 2 g via INTRAVENOUS
  Filled 2012-05-01: qty 50

## 2012-05-02 ENCOUNTER — Encounter (HOSPITAL_COMMUNITY): Payer: Self-pay | Admitting: Anesthesiology

## 2012-05-02 ENCOUNTER — Encounter (HOSPITAL_COMMUNITY)
Admission: RE | Disposition: A | Discharge status: Home / Self Care | Payer: Self-pay | Source: Ambulatory Visit | Attending: Internal Medicine

## 2012-05-02 ENCOUNTER — Observation Stay (HOSPITAL_COMMUNITY)
Admission: RE | Admit: 2012-05-02 | Discharge: 2012-05-04 | Disposition: A | Discharge status: Home / Self Care | Payer: Medicare Other | Source: Ambulatory Visit | Attending: Internal Medicine | Admitting: Internal Medicine

## 2012-05-02 ENCOUNTER — Inpatient Hospital Stay (HOSPITAL_COMMUNITY): Payer: Medicare Other | Admitting: Anesthesiology

## 2012-05-02 ENCOUNTER — Ambulatory Visit (HOSPITAL_COMMUNITY): Payer: Medicare Other

## 2012-05-02 ENCOUNTER — Encounter (HOSPITAL_COMMUNITY): Payer: Self-pay | Admitting: Internal Medicine

## 2012-05-02 ENCOUNTER — Encounter (HOSPITAL_COMMUNITY): Payer: Self-pay | Admitting: *Deleted

## 2012-05-02 ENCOUNTER — Encounter (HOSPITAL_COMMUNITY): Payer: Self-pay

## 2012-05-02 ENCOUNTER — Ambulatory Visit (HOSPITAL_COMMUNITY): Admit: 2012-05-02 | Payer: Self-pay | Admitting: Internal Medicine

## 2012-05-02 DIAGNOSIS — I2510 Atherosclerotic heart disease of native coronary artery without angina pectoris: Secondary | ICD-10-CM | POA: Insufficient documentation

## 2012-05-02 DIAGNOSIS — I509 Heart failure, unspecified: Secondary | ICD-10-CM | POA: Insufficient documentation

## 2012-05-02 DIAGNOSIS — I10 Essential (primary) hypertension: Secondary | ICD-10-CM | POA: Insufficient documentation

## 2012-05-02 DIAGNOSIS — T827XXA Infection and inflammatory reaction due to other cardiac and vascular devices, implants and grafts, initial encounter: Principal | ICD-10-CM

## 2012-05-02 DIAGNOSIS — Y831 Surgical operation with implant of artificial internal device as the cause of abnormal reaction of the patient, or of later complication, without mention of misadventure at the time of the procedure: Secondary | ICD-10-CM | POA: Insufficient documentation

## 2012-05-02 DIAGNOSIS — I2589 Other forms of chronic ischemic heart disease: Secondary | ICD-10-CM | POA: Insufficient documentation

## 2012-05-02 DIAGNOSIS — K219 Gastro-esophageal reflux disease without esophagitis: Secondary | ICD-10-CM | POA: Insufficient documentation

## 2012-05-02 DIAGNOSIS — E785 Hyperlipidemia, unspecified: Secondary | ICD-10-CM | POA: Insufficient documentation

## 2012-05-02 DIAGNOSIS — N289 Disorder of kidney and ureter, unspecified: Secondary | ICD-10-CM | POA: Insufficient documentation

## 2012-05-02 DIAGNOSIS — I5022 Chronic systolic (congestive) heart failure: Secondary | ICD-10-CM | POA: Insufficient documentation

## 2012-05-02 HISTORY — DX: Personal history of other medical treatment: Z92.89

## 2012-05-02 HISTORY — DX: Other forms of dyspnea: R06.09

## 2012-05-02 HISTORY — DX: Dyspnea, unspecified: R06.00

## 2012-05-02 HISTORY — PX: ICD LEAD REMOVAL: SHX5855

## 2012-05-02 HISTORY — DX: Unspecified osteoarthritis, unspecified site: M19.90

## 2012-05-02 HISTORY — DX: Chronic kidney disease, unspecified: N18.9

## 2012-05-02 HISTORY — DX: Personal history of other diseases of the musculoskeletal system and connective tissue: Z87.39

## 2012-05-02 HISTORY — PX: PACEMAKER REMOVAL: SHX5066

## 2012-05-02 HISTORY — DX: Angina pectoris, unspecified: I20.9

## 2012-05-02 HISTORY — DX: Heart failure, unspecified: I50.9

## 2012-05-02 LAB — SURGICAL PCR SCREEN
MRSA, PCR: NEGATIVE
Staphylococcus aureus: POSITIVE — AB

## 2012-05-02 SURGERY — LEAD REMOVAL
Anesthesia: LOCAL

## 2012-05-02 SURGERY — REMOVAL, ELECTRODE LEAD, ICD
Anesthesia: General | Site: Chest | Wound class: Clean

## 2012-05-02 MED ORDER — ONDANSETRON HCL 4 MG/2ML IJ SOLN: 4.0000 mg | Freq: Four times a day (QID) | INTRAMUSCULAR | Status: DC | PRN

## 2012-05-02 MED ORDER — MUPIROCIN 2 % EX OINT
TOPICAL_OINTMENT | CUTANEOUS | Status: AC
  Filled 2012-05-02: qty 22

## 2012-05-02 MED ORDER — TEMAZEPAM 15 MG PO CAPS: 30.0000 mg | ORAL_CAPSULE | Freq: Every day | ORAL | Status: DC

## 2012-05-02 MED ORDER — SUCCINYLCHOLINE CHLORIDE 20 MG/ML IJ SOLN
INTRAMUSCULAR | Status: DC | PRN
  Administered 2012-05-02: 120 mg via INTRAVENOUS

## 2012-05-02 MED ORDER — ALLOPURINOL 100 MG PO TABS
100.0000 mg | ORAL_TABLET | Freq: Every day | ORAL | Status: DC
  Filled 2012-05-02 (×3): qty 1

## 2012-05-02 MED ORDER — SODIUM CHLORIDE 0.9 % IJ SOLN: 3.0000 mL | INTRAMUSCULAR | Status: DC | PRN

## 2012-05-02 MED ORDER — SODIUM CHLORIDE 0.9 % IR SOLN
80.0000 mg | Status: DC
  Filled 2012-05-02: qty 2

## 2012-05-02 MED ORDER — HEPARIN SODIUM (PORCINE) 5000 UNIT/ML IJ SOLN
5000.0000 [IU] | Freq: Three times a day (TID) | INTRAMUSCULAR | Status: DC
  Administered 2012-05-02 – 2012-05-04 (×5): 5000 [IU] via SUBCUTANEOUS
  Filled 2012-05-02 (×8): qty 1

## 2012-05-02 MED ORDER — MUPIROCIN 2 % EX OINT
TOPICAL_OINTMENT | Freq: Two times a day (BID) | CUTANEOUS | Status: DC
Start: 2012-05-02 — End: 2012-05-02
  Administered 2012-05-02: 09:00:00 via NASAL
  Filled 2012-05-02: qty 22

## 2012-05-02 MED ORDER — CEFAZOLIN SODIUM-DEXTROSE 2-3 GM-% IV SOLR
2.0000 g | Freq: Four times a day (QID) | INTRAVENOUS | Status: AC
  Administered 2012-05-02 – 2012-05-03 (×3): 2 g via INTRAVENOUS
  Filled 2012-05-02 (×3): qty 50

## 2012-05-02 MED ORDER — ONDANSETRON HCL 4 MG/2ML IJ SOLN
INTRAMUSCULAR | Status: DC | PRN
  Administered 2012-05-02: 4 mg via INTRAVENOUS

## 2012-05-02 MED ORDER — ZOLPIDEM TARTRATE 5 MG PO TABS
5.0000 mg | ORAL_TABLET | Freq: Every day | ORAL | Status: DC
  Administered 2012-05-02 – 2012-05-03 (×2): 5 mg via ORAL
  Filled 2012-05-02 (×2): qty 1

## 2012-05-02 MED ORDER — MIDAZOLAM HCL 5 MG/5ML IJ SOLN
INTRAMUSCULAR | Status: DC | PRN
  Administered 2012-05-02: 1 mg via INTRAVENOUS

## 2012-05-02 MED ORDER — PHENYLEPHRINE HCL 10 MG/ML IJ SOLN
10.0000 mg | INTRAVENOUS | Status: DC | PRN
  Administered 2012-05-02: 50 ug/min via INTRAVENOUS

## 2012-05-02 MED ORDER — PHENYLEPHRINE HCL 10 MG/ML IJ SOLN
INTRAMUSCULAR | Status: DC | PRN
  Administered 2012-05-02: 40 ug via INTRAVENOUS

## 2012-05-02 MED ORDER — CEFAZOLIN SODIUM-DEXTROSE 2-3 GM-% IV SOLR: 2.0000 g | INTRAVENOUS | Status: DC

## 2012-05-02 MED ORDER — SODIUM CHLORIDE 0.9 % IR SOLN
80.0000 mg | Status: AC
  Administered 2012-05-02: 80 mg
  Filled 2012-05-02: qty 2

## 2012-05-02 MED ORDER — CHLORHEXIDINE GLUCONATE 4 % EX LIQD: 60.0000 mL | Freq: Once | CUTANEOUS | Status: DC

## 2012-05-02 MED ORDER — SODIUM CHLORIDE 0.9 % IV SOLN: 250.0000 mL | INTRAVENOUS | Status: DC

## 2012-05-02 MED ORDER — HYDROMORPHONE HCL PF 1 MG/ML IJ SOLN
0.2500 mg | INTRAMUSCULAR | Status: DC | PRN
  Administered 2012-05-02 (×2): 0.5 mg via INTRAVENOUS

## 2012-05-02 MED ORDER — ONDANSETRON HCL 4 MG/2ML IJ SOLN
INTRAMUSCULAR | Status: AC
  Administered 2012-05-02: 4 mg
  Filled 2012-05-02: qty 2

## 2012-05-02 MED ORDER — ASPIRIN EC 325 MG PO TBEC
325.0000 mg | DELAYED_RELEASE_TABLET | Freq: Every day | ORAL | Status: DC
  Administered 2012-05-02 – 2012-05-03 (×2): 325 mg via ORAL
  Filled 2012-05-02 (×3): qty 1

## 2012-05-02 MED ORDER — EPHEDRINE SULFATE 50 MG/ML IJ SOLN
INTRAMUSCULAR | Status: DC | PRN
  Administered 2012-05-02 (×2): 10 mg via INTRAVENOUS

## 2012-05-02 MED ORDER — SODIUM CHLORIDE 0.9 % IJ SOLN: 3.0000 mL | Freq: Two times a day (BID) | INTRAMUSCULAR | Status: DC

## 2012-05-02 MED ORDER — SODIUM CHLORIDE 0.9 % IR SOLN
Status: DC | PRN
  Administered 2012-05-02: 17:00:00

## 2012-05-02 MED ORDER — TRAZODONE HCL 50 MG PO TABS
50.0000 mg | ORAL_TABLET | Freq: Every day | ORAL | Status: DC
  Administered 2012-05-02 – 2012-05-03 (×2): 50 mg via ORAL
  Filled 2012-05-02 (×3): qty 1

## 2012-05-02 MED ORDER — LACTATED RINGERS IV SOLN
INTRAVENOUS | Status: DC | PRN
  Administered 2012-05-02: 16:00:00 via INTRAVENOUS

## 2012-05-02 MED ORDER — HYDROMORPHONE HCL PF 1 MG/ML IJ SOLN
INTRAMUSCULAR | Status: AC
  Administered 2012-05-02: 0.5 mg via INTRAVENOUS
  Filled 2012-05-02: qty 1

## 2012-05-02 MED ORDER — LISINOPRIL 10 MG PO TABS
10.0000 mg | ORAL_TABLET | Freq: Every day | ORAL | Status: DC
  Filled 2012-05-02 (×3): qty 1

## 2012-05-02 MED ORDER — PROPOFOL 10 MG/ML IV BOLUS
INTRAVENOUS | Status: DC | PRN
  Administered 2012-05-02: 140 mg via INTRAVENOUS

## 2012-05-02 MED ORDER — LIDOCAINE HCL (PF) 1 % IJ SOLN
INTRAMUSCULAR | Status: AC
  Filled 2012-05-02: qty 30

## 2012-05-02 MED ORDER — ACETAMINOPHEN 325 MG PO TABS
325.0000 mg | ORAL_TABLET | ORAL | Status: DC | PRN
  Administered 2012-05-02 – 2012-05-03 (×2): 650 mg via ORAL
  Administered 2012-05-03: 325 mg via ORAL
  Filled 2012-05-02 (×3): qty 2

## 2012-05-02 MED ORDER — SODIUM CHLORIDE 0.45 % IV SOLN: INTRAVENOUS | Status: DC

## 2012-05-02 MED ORDER — FENTANYL CITRATE 0.05 MG/ML IJ SOLN
INTRAMUSCULAR | Status: DC | PRN
  Administered 2012-05-02: 100 ug via INTRAVENOUS
  Administered 2012-05-02: 50 ug via INTRAVENOUS

## 2012-05-02 MED ORDER — LIDOCAINE HCL (PF) 1 % IJ SOLN
INTRAMUSCULAR | Status: DC | PRN
  Administered 2012-05-02: 30 mL

## 2012-05-02 MED ORDER — CARVEDILOL 12.5 MG PO TABS
12.5000 mg | ORAL_TABLET | Freq: Two times a day (BID) | ORAL | Status: DC
  Administered 2012-05-03 – 2012-05-04 (×3): 12.5 mg via ORAL
  Filled 2012-05-02 (×5): qty 1

## 2012-05-02 MED ORDER — LIDOCAINE HCL (CARDIAC) 20 MG/ML IV SOLN
INTRAVENOUS | Status: DC | PRN
  Administered 2012-05-02: 50 mg via INTRAVENOUS

## 2012-05-02 SURGICAL SUPPLY — 35 items
CANISTER SUCTION 2500CC (MISCELLANEOUS) ×2 IMPLANT
CLOTH BEACON ORANGE TIMEOUT ST (SAFETY) ×2 IMPLANT
DRAPE BACK TABLE (DRAPES) ×1 IMPLANT
DRAPE C-ARM 42X72 X-RAY (DRAPES) ×2 IMPLANT
DRAPE CARDIOVASCULAR INCISE (DRAPES) ×2
DRAPE INCISE IOBAN 66X45 STRL (DRAPES) ×2 IMPLANT
DRAPE PROXIMA HALF (DRAPES) ×4 IMPLANT
DRAPE SRG 135X102X78XABS (DRAPES) ×1 IMPLANT
DRSG OPSITE 6X11 MED (GAUZE/BANDAGES/DRESSINGS) ×1 IMPLANT
ELECT LOOP CUT MONO 26F .012 R (MISCELLANEOUS) ×1 IMPLANT
ELECT REM PT RETURN 9FT ADLT (ELECTROSURGICAL) ×2
ELECTRODE REM PT RTRN 9FT ADLT (ELECTROSURGICAL) ×1 IMPLANT
GAUZE PACKING IODOFORM 1 (PACKING) ×1 IMPLANT
GAUZE SPONGE 4X4 16PLY XRAY LF (GAUZE/BANDAGES/DRESSINGS) ×2 IMPLANT
GLOVE BIO SURGEON STRL SZ7 (GLOVE) ×1 IMPLANT
GLOVE BIO SURGEON STRL SZ8 (GLOVE) ×2 IMPLANT
GLOVE BIOGEL PI IND STRL 7.5 (GLOVE) ×1 IMPLANT
GLOVE BIOGEL PI INDICATOR 7.5 (GLOVE) ×3
GLOVE ECLIPSE 7.5 STRL STRAW (GLOVE) ×1 IMPLANT
GOWN PREVENTION PLUS XLARGE (GOWN DISPOSABLE) ×2 IMPLANT
GOWN STRL NON-REIN LRG LVL3 (GOWN DISPOSABLE) ×5 IMPLANT
KIT ROOM TURNOVER OR (KITS) ×2 IMPLANT
PAD ARMBOARD 7.5X6 YLW CONV (MISCELLANEOUS) ×4 IMPLANT
PAD DEFIB PEDI PADZ (MISCELLANEOUS) ×1 IMPLANT
PAD DEFIB STAT PADZ MULTI (MISCELLANEOUS) ×1 IMPLANT
PENCIL BUTTON HOLSTER BLD 10FT (ELECTRODE) IMPLANT
SPONGE GAUZE 4X4 12PLY (GAUZE/BANDAGES/DRESSINGS) ×2 IMPLANT
SUT PROLENE 2 0 CT2 30 (SUTURE) ×3 IMPLANT
SUT SILK 0 FSL (SUTURE) ×2 IMPLANT
SUT VIC AB 2-0 CT2 18 VCP726D (SUTURE) ×1 IMPLANT
SUT VIC AB 3-0 X1 27 (SUTURE) ×1 IMPLANT
TOWEL NATURAL 10PK STERILE (DISPOSABLE) ×1 IMPLANT
TOWEL OR 17X24 6PK STRL BLUE (TOWEL DISPOSABLE) ×4 IMPLANT
TUBE CONNECTING 12X1/4 (SUCTIONS) ×2 IMPLANT
YANKAUER SUCT BULB TIP NO VENT (SUCTIONS) ×2 IMPLANT

## 2012-05-02 NOTE — Anesthesia Preprocedure Evaluation (Addendum)
Anesthesia Evaluation  Patient identified by MRN, date of birth, ID band Patient awake    Reviewed: Allergy & Precautions, H&P , NPO status , Patient's Chart, lab work & pertinent test results, reviewed documented beta blocker date and time   History of Anesthesia Complications Negative for: history of anesthetic complications  Airway Mallampati: II TM Distance: >3 FB Neck ROM: Full    Dental  (+) Teeth Intact and Dental Advisory Given   Pulmonary shortness of breath and with exertion, former smoker,  breath sounds clear to auscultation        Cardiovascular hypertension, Pt. on home beta blockers + CAD, + Past MI and +CHF + dysrhythmias (pvcs) + pacemaker (pacemaker lead loose approx. 1year ago) + Cardiac Defibrillator Rhythm:Regular Rate:Normal     Neuro/Psych negative neurological ROS     GI/Hepatic   Endo/Other  negative endocrine ROS  Renal/GU Renal InsufficiencyRenal disease     Musculoskeletal   Abdominal   Peds  Hematology Stopped aspirin on Monday   Anesthesia Other Findings   Reproductive/Obstetrics                          Anesthesia Physical Anesthesia Plan  ASA: IV  Anesthesia Plan: General   Post-op Pain Management:    Induction: Intravenous  Airway Management Planned: Oral ETT  Additional Equipment:   Intra-op Plan:   Post-operative Plan: Possible Post-op intubation/ventilation  Informed Consent:   Dental advisory given  Plan Discussed with: CRNA, Anesthesiologist and Surgeon  Anesthesia Plan Comments:         Anesthesia Quick Evaluation

## 2012-05-02 NOTE — Op Note (Signed)
ICD system extraction without immediate complication. R#[id number].

## 2012-05-02 NOTE — H&P (View-Only) (Signed)
HPI [name] returns today for followup. He is a very pleasant 71 year old man who underwent insertion of a biventricular ICD approximately 3 years ago. Postoperatively, the patient developed atrial tachycardia which was successfully ablated at the AV node. Fortunately, he did not develop heart block. Patient is an avid golfer in approximately one year after surgery, developed a late lead dislodgment since that his left ventricular lead was not functioning correctly. His device was reprogrammed and he has done well. I saw the patient several months ago and he was noted to have palpitations and frequent PVCs but was largely asymptomatic. He denies fevers or chills. Over the last several weeks, he has noted swelling and discomfort in his ICD insertion site. He notes the skin in the lower portion has become red and 10. In the upper portion, the pocket is full though not fluctuant. He otherwise feels well. Allergies  Allergen Reactions  . Nsaids Other (See Comments)    CKD  . Ibuprofen   . Oxycodone Hcl Er   . Pregabalin   . Pregabalin      Current Outpatient Prescriptions  Medication Sig Dispense Refill  . allopurinol (ZYLOPRIM) 100 MG tablet Take 100 mg by mouth daily.        Marland Kitchen aspirin EC 325 MG EC tablet Take 325 mg by mouth daily after supper.        . cholecalciferol (VITAMIN D) 1000 UNITS tablet Take 1,000 Units by mouth daily.        Marland Kitchen COREG 12.5 MG tablet TAKE  (1)  TABLET TWICE A DAY.  180 each  3  . docusate sodium (COLACE) 50 MG capsule Take by mouth daily.        Marland Kitchen ezetimibe (ZETIA) 10 MG tablet Take 5 mg by mouth every other day.        . fish oil-omega-3 fatty acids 1000 MG capsule Take 1 g by mouth daily.       Marland Kitchen lisinopril (PRINIVIL,ZESTRIL) 10 MG tablet Take 10 mg by mouth daily.        . multivitamin (THERAGRAN) per tablet Take 1 tablet by mouth daily.        . niacin (NIASPAN) 1000 MG CR tablet Take 1,500 mg by mouth at bedtime.       . pravastatin (PRAVACHOL) 80 MG tablet  Take 80 mg by mouth at bedtime.        . temazepam (RESTORIL) 30 MG capsule Take 30 mg by mouth at bedtime as needed.        . traZODone (DESYREL) 50 MG tablet Take 50 mg by mouth at bedtime.           Past Medical History  Diagnosis Date  . ASCVD (arteriosclerotic cardiovascular disease)   . MI, old   . Other and unspecified hyperlipidemia   . Hx of CABG   . Essential hypertension, benign   . Gastroesophageal reflux disease with hiatal hernia   . Osteoarthrosis and allied disorders   . Impotence   . Hemorrhoids   . Colon polyps   . PVC's (premature ventricular contractions)     ROS:   All systems reviewed and negative except as noted in the HPI.   Past Surgical History  Procedure Date  . Coronary artery bypass graft 1993  . Tonsillectomy and adenoidectomy   . Left knee surgery   . Rt shoulder surgery   . Lumbar spine surgery march 2006     Family History  Problem Relation Age of Onset  .  Hypertension Mother 53  . Prostate cancer Father 38     History   Social History  . Marital Status: Married    Spouse Name: 4    Number of Children: N/A  . Years of Education: N/A   Occupational History  . RETIRED    Social History Main Topics  . Smoking status: Former Smoker    Types: Cigarettes  . Smokeless tobacco: Not on file  . Alcohol Use: Yes  . Drug Use: No  . Sexually Active: Yes   Other Topics Concern  . Not on file   Social History Narrative   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  He is married and lives with his wife of 31 years.  He has four daughters and 11 grandchildren all of which are healthy.  He denies any drug or alcohol use. Smoking cessation occurred five years ago.     BP 120/78  Pulse 54  Ht 5\' 10"  (1.778 m)  Wt 189 lb 6.4 oz (85.911 kg)  BMI 27.18 kg/m2  SpO2 97%  Physical Exam:  Well appearing 71 year old man, NAD HEENT: Unremarkable Neck:  No  JVD, no thyromegally Lungs:  Clear with no wheezes, rales, or rhonchi. ICD pocket is thinned and erythematous at the left lateral border. There is fullness over the pocket. HEART:  Regular rate rhythm, no murmurs, no rubs, no clicks Abd:  soft, positive bowel sounds, no organomegally, no rebound, no guarding Ext:  2 plus pulses, no edema, no cyanosis, no clubbing Skin:  No rashes no nodules Neuro:  CN II through XII intact, motor grossly intact  DEVICE  Normal device function.  See PaceArt for details.   Assess/Plan:

## 2012-05-02 NOTE — Progress Notes (Signed)
Orthopedic Tech Progress Note Patient Details:  [name] [birth year] [id number]  Ortho Devices Type of Ortho Device: Arm foam sling Ortho Device/Splint Location: (L) UE Ortho Device/Splint Interventions: Application   Jennye Moccasin 05/02/2012, 7:06 PM

## 2012-05-02 NOTE — Interval H&P Note (Signed)
History and Physical Interval Note:  05/02/2012 1:41 PM  [name]  has presented today for surgery, with the diagnosis of pacemaker infection  The various methods of treatment have been discussed with the patient and family. After consideration of risks, benefits and other options for treatment, the patient has consented to  Procedure(s) (LRB) with comments: ICD LEAD REMOVAL (N/A) - ICD extraction as a surgical intervention .  The patient's history has been reviewed, patient examined, no change in status, stable for surgery.  I have reviewed the patient's chart and labs.  Questions were answered to the patient's satisfaction.     Leonia Reeves.D.

## 2012-05-02 NOTE — Preoperative (Signed)
Beta Blockers   Reason not to administer Beta Blockers:Not Applicable 

## 2012-05-02 NOTE — Transfer of Care (Signed)
Immediate Anesthesia Transfer of Care Note  Patient: [name]  Procedure(s) Performed: Procedure(s) (LRB) with comments: ICD LEAD REMOVAL (N/A) - ICD extraction  Patient Location: PACU  Anesthesia Type:General  Level of Consciousness: awake, alert  and oriented  Airway & Oxygen Therapy: Patient Spontanous Breathing and Patient connected to face mask oxygen  Post-op Assessment: Report given to PACU RN, Post -op Vital signs reviewed and stable and Patient moving all extremities  Post vital signs: Reviewed and stable  Complications: No apparent anesthesia complications

## 2012-05-02 NOTE — Anesthesia Postprocedure Evaluation (Signed)
  Anesthesia Post-op Note  Patient: [name]  Procedure(s) Performed: Procedure(s) (LRB) with comments: ICD LEAD REMOVAL (N/A) - ICD extraction  Patient Location: PACU  Anesthesia Type:General  Level of Consciousness: awake  Airway and Oxygen Therapy: Patient Spontanous Breathing  Post-op Pain: mild  Post-op Assessment: Post-op Vital signs reviewed  Post-op Vital Signs: Reviewed  Complications: No apparent anesthesia complications

## 2012-05-03 ENCOUNTER — Encounter (HOSPITAL_COMMUNITY): Payer: Self-pay | Admitting: *Deleted

## 2012-05-03 MED ORDER — CEPHALEXIN 500 MG PO CAPS
500.0000 mg | ORAL_CAPSULE | Freq: Two times a day (BID) | ORAL | Status: DC
  Filled 2012-05-03 (×2): qty 1

## 2012-05-03 MED ORDER — HYDROCODONE-ACETAMINOPHEN 5-325 MG PO TABS
1.0000 | ORAL_TABLET | Freq: Four times a day (QID) | ORAL | Status: DC | PRN
  Administered 2012-05-03 – 2012-05-04 (×3): 2 via ORAL
  Filled 2012-05-03 (×3): qty 2

## 2012-05-03 NOTE — Discharge Summary (Signed)
** ORIGINAL NOTE DRAFTED BY BROOKE EDMISTEN, PA-C. EDITS MADE WHERE NECESSARY BY R. Clare Charon, PA-C **  ELECTROPHYSIOLOGY DISCHARGE SUMMARY    Patient ID: [name],  MRN: [id number], DOB/AGE: [birth year] 71 y.o.  Admit date: 05/02/2012 Discharge date: 05/03/2012  Primary Care Physician: Rudi Heap, MD Primary EP: Lewayne Bunting, MD  Primary Discharge Diagnosis:  1. 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 Ischemic CM 2. CAD s/p CABG 3. Chronic systolic HF 4. Atrial tachycardia 5. HTN 6. Dyslipidemia 7. PVCs 8. GERD  Procedures This Admission:  1. BiV ICD system extraction 05/02/2012   History and Hospital Course:  [name] is a pleasant 71 year old man who underwent insertion of a biventricular ICD approximately 3 years ago. He was evaluated recently by Dr. [name] in follow-up on 04/16/2012 at which time he reported swelling and discomfort at his ICD insertion site x several weeks. He described the skin in the lower portion as red and tender. The pocket also felt swollen. He denied fever or chills and was hemodyamically stable. After Dr. [name] assessment, it was felt [name] was experiencing a chronic indolent pocket infection and he was scheduled for device and lead extraction. He presented 05/02/2012 for device and lead extraction. [name] tolerated this procedure well without any immediate complication. He remains hemodynamically stable and afebrile. His explant site is packed with Iodaban and is without significant drainage, bleeding or hematoma. He has been given discharge instructions including wound care and activity restrictions. He will follow-up in 10 days for wound check. He was provided a prescription for Keflex; otherwise, there were no changes made to his medications. Dr. [name] did not feel [name] needed a LifeVest at discharge. He has been seen, examined and deemed stable for discharge today by Dr.  [name].  Discharge Vitals: Blood pressure 124/71, pulse 87, temperature 97.7 F (36.5 C), temperature source Oral, resp. rate 18, height 5\' 10"  (1.778 m), weight 186 lb 8.2 oz (84.6 kg), SpO2 94.00%.   Labs: Lab Results  Component Value Date   WBC 5.8 04/29/2012   HGB 13.2 04/29/2012   HCT 40.8 04/29/2012   MCV 98.7 04/29/2012   PLT 152.0 04/29/2012    Lab 04/29/12 0937  NA 140  K 4.4  CL 104  CO2 30  BUN 20  CREATININE 1.2  CALCIUM 9.4  PROT --  BILITOT --  ALKPHOS --  ALT --  AST --  GLUCOSE 108*   No results found for this basename: CKTOTAL, CKMB, CKMBINDEX, TROPONINI    No results found for this basename: CHOL   No results found for this basename: HDL   No results found for this basename: LDLCALC   No results found for this basename: TRIG   No results found for this basename: CHOLHDL   No results found for this basename: LDLDIRECT   No results found for this basename: DDIMER   No results found for this basename: INR in the last 72 hours  Disposition:  The patient is being discharged in stable condition.  Follow-up: Follow-up Information    Follow up with [facility] On 05/13/2012. (At 2:00 PM for wound check)    Contact information:   [address]  [address] [place] [address] [phone]        Discharge Medications:    Medication List     As of 05/03/2012 12:27 PM    ASK your doctor about these  medications         allopurinol 100 MG tablet   Commonly known as: ZYLOPRIM   Take 100 mg by mouth daily.      aspirin EC 325 MG tablet   Take 325 mg by mouth at bedtime.      carvedilol 12.5 MG tablet   Commonly known as: COREG   Take 12.5 mg by mouth 2 (two) times daily with a meal.      cholecalciferol 1000 UNITS tablet   Commonly known as: VITAMIN D   Take 1,000 Units by mouth daily.      lisinopril 10 MG tablet   Commonly known as: PRINIVIL,ZESTRIL   Take 10 mg by mouth daily.      multivitamin per tablet   Take  1 tablet by mouth daily.      niacin 750 MG CR tablet   Commonly known as: NIASPAN   Take 1,500 mg by mouth at bedtime.      omega-3 acid ethyl esters 1 G capsule   Commonly known as: LOVAZA   Take 1 g by mouth at bedtime.      pravastatin 80 MG tablet   Commonly known as: PRAVACHOL   Take 80 mg by mouth at bedtime.      temazepam 30 MG capsule   Commonly known as: RESTORIL   Take 30 mg by mouth at bedtime.      traZODone 50 MG tablet   Commonly known as: DESYREL   Take 50 mg by mouth at bedtime.      Duration of Discharge Encounter: Greater than 30 minutes including physician time.  Signed, Rick Duff, PA-C 05/03/2012, 12:27 PM  Signed, R. Hurman Horn, PA-C 05/04/12, 8:06 AM

## 2012-05-03 NOTE — Progress Notes (Signed)
Patient ID: [name], male   DOB: [birth year], 71 y.o.   MRN: [id number] Subjective:  No chest pain or sob. Minimal incisional tenderness.  Objective:  Vital Signs in the last 24 hours: Temp:  [96.7 F (35.9 C)-97.7 F (36.5 C)] 97.7 F (36.5 C) (11/08 0821) Pulse Rate:  [49-87] 87  (11/08 0925) Resp:  [11-23] 18  (11/08 0925) BP: (109-151)/(50-93) 124/71 mmHg (11/08 0925) SpO2:  [94 %-100 %] 94 % (11/08 0925) Arterial Line BP: (155-177)/(69-86) 164/77 mmHg (11/07 2045) Weight:  [186 lb 8.2 oz (84.6 kg)] 186 lb 8.2 oz (84.6 kg) (11/08 0100)  Intake/Output from previous day: 11/07 0701 - 11/08 0700 In: 725 [I.V.:625; IV Piggyback:100] Out: 575 [Urine:575] Intake/Output from this shift:    Physical Exam: Well appearing NAD HEENT: Unremarkable Neck:  No JVD, no thyromegally Lungs:  Clear with no wheezes HEART:  Regular rate rhythm, no murmurs, no rubs, no clicks Abd:  Flat, positive bowel sounds, no organomegally, no rebound, no guarding Ext:  2 plus pulses, no edema, no cyanosis, no clubbing Skin:  No rashes no nodules Neuro:  CN II through XII intact, motor grossly intact  Lab Results: No results found for this basename: WBC:2,HGB:2,PLT:2 in the last 72 hours No results found for this basename: NA:2,K:2,CL:2,CO2:2,GLUCOSE:2,BUN:2,CREATININE:2 in the last 72 hours No results found for this basename: TROPONINI:2,CK,MB:2 in the last 72 hours Hepatic Function Panel No results found for this basename: PROT,ALBUMIN,AST,ALT,ALKPHOS,BILITOT,BILIDIR,IBILI in the last 72 hours No results found for this basename: CHOL in the last 72 hours No results found for this basename: PROTIME in the last 72 hours  Imaging: Dg Chest Portable 1 View  05/02/2012  *RADIOLOGY REPORT*  Clinical Data: Status post ICD extraction  PORTABLE CHEST - 1 VIEW  Comparison: 04/29/2012  Findings: Previous median sternotomy CABG procedure.  Moderate cardiac enlargement.  Interval removal of left chest wall  AICD and associated leads.  Atelectasis is noted in the left base.  There is pulmonary venous congestion.  No pleural effusions identified or edema.  No pneumothorax noted.  A small amount of gas is noted within the soft tissues of the left upper chest.  IMPRESSION:  1.  Status post removal of left chest wall AICD and associated leads. 2.  Cardiac enlargement and pulmonary venous congestion   Original Report Authenticated By: Signa Kell, M.D.     Cardiac Studies: Tele - nsr with pvc's Assessment/Plan:  1. ICD system infection 2. S/p extraction 3. Chronic class 2 CHF Rec: continue post op course. DC tomorrow. Keflex for 10 days as an outpatient. Remove packing in the morning.  LOS: 1 day    Lewayne Bunting 05/03/2012, 9:42 AM

## 2012-05-03 NOTE — Op Note (Signed)
[name], [name]                 ACCOUNT NO.:  [id number]  MEDICAL RECORD NO.:  [id number]  LOCATION:  6531                         FACILITY:  [facility]  PHYSICIAN:  Doylene Canning. Ladona Ridgel, MD    DATE OF BIRTH:  [birth year]  DATE OF PROCEDURE:  05/02/2012 DATE OF DISCHARGE:                              OPERATIVE REPORT   PROCEDURE PERFORMED:  Removal of ICD system utilizing lead extraction.  INTRODUCTION:  The patient is a 71 year old male with an ischemic cardiomyopathy status post MI, status post bypass surgery.  He underwent ICD implantation secondary to heart failure and left ventricular dysfunction with an EF of 30% to 35% three years ago.  His procedure was notable in that his left ventricular lead was difficult to place. Approximately 1 year after successful placement, he was playing golf and felt some chest discomfort and was subsequently found to have lead dislodgement.  Because his heart failure symptoms did not really change with removal of the lead, it was deemed most appropriate to not revise the lead pocket.  Approximately 3 months ago, the patient noted some erythema over his pacemaker defibrillator pocket, which has resulted in increased swelling and near erosion of his skin at the lower lateral portion of the pocket.  He is now referred for ICD system extraction.  PROCEDURE:  After informed consent was obtained, the patient was taken to the operating room in a fasting state.  General anesthesia was provided under the direction of Dr.  Randa Evens.  30 mL of lidocaine was infiltrated over the old ICD insertion site and a 7 cm incision was carried out over this region.  Electrocautery was utilized to dissect down to the fascial plane.  The ICD pocket was entered.  There was dense fibrous scar tissue as well as areas of infectious tissue with minimal amounts of purulence.  The very dense amount of scar tissue debrided with electrocautery and the generator was removed in total.  The  leads were freed up from their scar tissue with electrocautery.  At this point, the pocket was irrigated and attention was then turned to removal of the LV, RV, and atrial leads.  The 1st lead attempted for extraction was LV lead.  A 0.014 angioplasty guidewire was advanced into the lead and it was gently retracted and was removed with modest attention. Next, the atrial lead was focused on and attention was turned towards removal of the atrial lead.  The 52 cm stylet was advanced into the lead and the helix retracted into the body resulted in dislodgement of the atrial lead from its electrode myocardial interface.  Additional gentle traction resulted in removal of the lead in total.  The defibrillator lead was then focused attention on.  A 65 cm stylet was advanced into the lead and the helix was retracted backwards.  Counterclockwise rotation was then placed on the lead and gentle traction applied to the lead and under modest difficulty.  The lead was removed in total.  There were no hemodynamic sequela.  Additional hemostasis was obtained and the pocket was irrigated and debrided extensively.  The incision was closed with 2-0 Prolene suture utilizing simple mattress sutures.  Packing material  was then placed in the pocket, a pressure dressing was applied. The patient was returned to recovery area in satisfactory condition.  COMPLICATIONS:  There were no immediate procedure complications.  RESULTS:  This demonstrates successful extraction of a biventricular ICD system in total without immediate procedure complication.     Doylene Canning. Ladona Ridgel, MD     GWT/MEDQ  D:  05/02/2012  T:  05/03/2012  Job:  [id number]  cc:   [name], M.D. Lyn Records, M.D.

## 2012-05-03 NOTE — Progress Notes (Signed)
Utilization review completed.  

## 2012-05-04 MED ORDER — HYDROCODONE-ACETAMINOPHEN 5-325 MG PO TABS: 1.0000 | ORAL_TABLET | Freq: Four times a day (QID) | ORAL | Status: DC | PRN

## 2012-05-04 MED ORDER — CEPHALEXIN 500 MG PO CAPS: 500.0000 mg | ORAL_CAPSULE | Freq: Three times a day (TID) | ORAL | Status: AC

## 2012-05-04 MED ORDER — MORPHINE SULFATE 2 MG/ML IJ SOLN
2.0000 mg | Freq: Once | INTRAMUSCULAR | Status: AC
  Administered 2012-05-04: 08:00:00 2 mg via INTRAVENOUS

## 2012-05-04 MED ORDER — MORPHINE SULFATE 2 MG/ML IJ SOLN
INTRAMUSCULAR | Status: AC
  Filled 2012-05-04: qty 1

## 2012-05-06 ENCOUNTER — Encounter (HOSPITAL_COMMUNITY): Payer: Self-pay | Admitting: Internal Medicine

## 2012-05-06 ENCOUNTER — Telehealth: Payer: Self-pay | Admitting: Internal Medicine

## 2012-05-06 NOTE — Telephone Encounter (Signed)
Spoke w/[name] and advised would speak to Dr Johney Frame in regards to this. Will call [name] once answer from Dr Johney Frame.

## 2012-05-06 NOTE — Telephone Encounter (Signed)
New problem:    How do they go about buying a home defib  After discussion with Dr. Ladona Ridgel.

## 2012-05-07 NOTE — Telephone Encounter (Signed)
Spoke w/Dr Allred and recommended pt have conversation with Dr Ladona Ridgel in regards to buying home defib. Pt scheduled for wound check on 05-13-12 and Dr Ladona Ridgel in office that day as well. LMOM letting know would discuss when pt comes in for wound check. Pt to return call if anymore questions.

## 2012-05-13 ENCOUNTER — Ambulatory Visit (INDEPENDENT_AMBULATORY_CARE_PROVIDER_SITE_OTHER): Payer: Medicare Other | Admitting: *Deleted

## 2012-05-13 DIAGNOSIS — I428 Other cardiomyopathies: Secondary | ICD-10-CM

## 2012-05-13 NOTE — Progress Notes (Signed)
Lateral suture removed and wound drained of old blood and pressure dressing applied by Dr. Ladona Ridgel.  Patient to return 05/16/12 in [facility] for a recheck of the wound with Dr. Ladona Ridgel.

## 2012-06-05 ENCOUNTER — Other Ambulatory Visit: Payer: Self-pay | Admitting: Cardiology

## 2012-08-13 ENCOUNTER — Encounter: Payer: Self-pay | Admitting: Internal Medicine

## 2012-08-14 ENCOUNTER — Other Ambulatory Visit: Payer: Self-pay | Admitting: Dermatology

## 2012-09-12 ENCOUNTER — Other Ambulatory Visit: Payer: Self-pay | Admitting: Family Medicine

## 2012-10-07 ENCOUNTER — Other Ambulatory Visit: Payer: Self-pay | Admitting: Family Medicine

## 2012-10-30 ENCOUNTER — Other Ambulatory Visit: Payer: Self-pay | Admitting: Family Medicine

## 2012-10-31 ENCOUNTER — Encounter: Payer: Self-pay | Admitting: Internal Medicine

## 2012-12-11 ENCOUNTER — Other Ambulatory Visit: Payer: Self-pay | Admitting: Family Medicine

## 2012-12-12 ENCOUNTER — Other Ambulatory Visit: Payer: Self-pay

## 2012-12-12 NOTE — Telephone Encounter (Signed)
Last seen 08/13/12  [facility]  If approved call in and inform patient

## 2012-12-13 ENCOUNTER — Other Ambulatory Visit: Payer: Self-pay | Admitting: *Deleted

## 2012-12-13 MED ORDER — TEMAZEPAM 30 MG PO CAPS: 30.0000 mg | ORAL_CAPSULE | Freq: Every day | ORAL | Status: DC

## 2012-12-13 NOTE — Telephone Encounter (Signed)
Please call in rxfor temazepam ) refills

## 2012-12-13 NOTE — Telephone Encounter (Signed)
Last filled 11/12/12, only sees [facility]. If approved, have nurse call into [facility]

## 2012-12-13 NOTE — Telephone Encounter (Signed)
Med at pharm 

## 2012-12-23 ENCOUNTER — Other Ambulatory Visit: Payer: Self-pay | Admitting: Family Medicine

## 2013-01-14 ENCOUNTER — Other Ambulatory Visit: Payer: Self-pay | Admitting: Family Medicine

## 2013-01-14 MED ORDER — TEMAZEPAM 30 MG PO CAPS: 30.0000 mg | ORAL_CAPSULE | Freq: Every day | ORAL | Status: DC

## 2013-01-14 NOTE — Telephone Encounter (Signed)
Last filled 12/13/12 

## 2013-01-14 NOTE — Telephone Encounter (Signed)
Called into [facility]

## 2013-01-29 ENCOUNTER — Other Ambulatory Visit: Payer: Self-pay

## 2013-02-01 ENCOUNTER — Other Ambulatory Visit: Payer: Self-pay | Admitting: Family Medicine

## 2013-02-04 NOTE — Telephone Encounter (Signed)
Last lipids 08/13/12   [facility]

## 2013-02-19 ENCOUNTER — Other Ambulatory Visit: Payer: Self-pay | Admitting: Dermatology

## 2013-02-26 ENCOUNTER — Other Ambulatory Visit: Payer: Self-pay | Admitting: Family Medicine

## 2013-03-06 ENCOUNTER — Other Ambulatory Visit: Payer: Self-pay | Admitting: Family Medicine

## 2013-03-12 ENCOUNTER — Other Ambulatory Visit: Payer: Self-pay | Admitting: *Deleted

## 2013-03-12 ENCOUNTER — Other Ambulatory Visit: Payer: Self-pay | Admitting: Family Medicine

## 2013-03-12 MED ORDER — TEMAZEPAM 30 MG PO CAPS: 30.0000 mg | ORAL_CAPSULE | Freq: Every day | ORAL | Status: DC

## 2013-03-12 NOTE — Telephone Encounter (Signed)
LAST RF 02/10/13. LAST OV 2/14. CALL IN [facility]. SEE ALLERGIES PLEASE.

## 2013-03-17 ENCOUNTER — Other Ambulatory Visit: Payer: Self-pay | Admitting: Family Medicine

## 2013-03-27 ENCOUNTER — Ambulatory Visit (INDEPENDENT_AMBULATORY_CARE_PROVIDER_SITE_OTHER): Payer: Medicare Other | Admitting: Family Medicine

## 2013-03-27 ENCOUNTER — Encounter: Payer: Self-pay | Admitting: Family Medicine

## 2013-03-27 VITALS — BP 124/80 | HR 61 | Temp 97.8°F | Ht 70.0 in | Wt 185.0 lb

## 2013-03-27 DIAGNOSIS — I25810 Atherosclerosis of coronary artery bypass graft(s) without angina pectoris: Secondary | ICD-10-CM

## 2013-03-27 DIAGNOSIS — E559 Vitamin D deficiency, unspecified: Secondary | ICD-10-CM

## 2013-03-27 DIAGNOSIS — E785 Hyperlipidemia, unspecified: Secondary | ICD-10-CM

## 2013-03-27 DIAGNOSIS — M109 Gout, unspecified: Secondary | ICD-10-CM

## 2013-03-27 DIAGNOSIS — J309 Allergic rhinitis, unspecified: Secondary | ICD-10-CM | POA: Insufficient documentation

## 2013-03-27 DIAGNOSIS — I2510 Atherosclerotic heart disease of native coronary artery without angina pectoris: Secondary | ICD-10-CM

## 2013-03-27 DIAGNOSIS — N400 Enlarged prostate without lower urinary tract symptoms: Secondary | ICD-10-CM

## 2013-03-27 DIAGNOSIS — Z862 Personal history of diseases of the blood and blood-forming organs and certain disorders involving the immune mechanism: Secondary | ICD-10-CM

## 2013-03-27 DIAGNOSIS — Z951 Presence of aortocoronary bypass graft: Secondary | ICD-10-CM

## 2013-03-27 DIAGNOSIS — I509 Heart failure, unspecified: Secondary | ICD-10-CM

## 2013-03-27 DIAGNOSIS — Z23 Encounter for immunization: Secondary | ICD-10-CM

## 2013-03-27 DIAGNOSIS — M1990 Unspecified osteoarthritis, unspecified site: Secondary | ICD-10-CM

## 2013-03-27 DIAGNOSIS — I10 Essential (primary) hypertension: Secondary | ICD-10-CM

## 2013-03-27 DIAGNOSIS — I7090 Unspecified atherosclerosis: Secondary | ICD-10-CM

## 2013-03-27 LAB — POCT CBC
Granulocyte percent: 59 %G (ref 37–80)
MCV: 95.2 fL (ref 80–97)
MPV: 6 fL (ref 0–99.8)
POC Granulocyte: 4.4 (ref 2–6.9)
POC LYMPH PERCENT: 33.6 %L (ref 10–50)
Platelet Count, POC: 162 10*3/uL (ref 142–424)
RDW, POC: 14.3 %

## 2013-03-27 MED ORDER — ALLOPURINOL 100 MG PO TABS: ORAL_TABLET | ORAL | Status: DC

## 2013-03-27 MED ORDER — PRAVASTATIN SODIUM 40 MG PO TABS: ORAL_TABLET | ORAL | Status: DC

## 2013-03-27 MED ORDER — LISINOPRIL 10 MG PO TABS: 10.0000 mg | ORAL_TABLET | Freq: Every day | ORAL | Status: DC

## 2013-03-27 MED ORDER — TEMAZEPAM 30 MG PO CAPS: 30.0000 mg | ORAL_CAPSULE | Freq: Every day | ORAL | Status: DC

## 2013-03-27 MED ORDER — CARVEDILOL 12.5 MG PO TABS: ORAL_TABLET | ORAL | Status: DC

## 2013-03-27 MED ORDER — FLUTICASONE PROPIONATE 50 MCG/ACT NA SUSP: 2.0000 | Freq: Every day | NASAL | Status: DC

## 2013-03-27 MED ORDER — NIACIN ER (ANTIHYPERLIPIDEMIC) 750 MG PO TBCR: EXTENDED_RELEASE_TABLET | ORAL | Status: DC

## 2013-03-27 MED ORDER — TRAZODONE HCL 50 MG PO TABS: 50.0000 mg | ORAL_TABLET | Freq: Every day | ORAL | Status: DC

## 2013-03-27 MED ORDER — OMEGA-3-ACID ETHYL ESTERS 1 G PO CAPS: ORAL_CAPSULE | ORAL | Status: DC

## 2013-03-27 NOTE — Patient Instructions (Addendum)
Continue current medications. Continue good therapeutic lifestyle changes.  Fall precautions discussed with patient. Follow up as planned and earlier as needed.  you will get your flu shot and Prevnar today. We will arrange to have appointment with Dr. Verdis Prime the cardiologist, for routine followup

## 2013-03-27 NOTE — Progress Notes (Signed)
Subjective:    Patient ID: [name], male    DOB: [birth year], 72 y.o.   MRN: [id number]  HPI Pt here for follow up and management of chronic medical problems. Patient is doing well without any specific complaints.   Patient Active Problem List   Diagnosis Date Noted  . ICD (implantable cardiac defibrillator) infection 04/16/2012  . PVC's (premature ventricular contractions)   . ASCVD (arteriosclerotic cardiovascular disease)   . MI, old   . Hx of CABG   . Essential hypertension, benign   . Gastroesophageal reflux disease with hiatal hernia   . Osteoarthrosis and allied disorders   . Impotence   . Hemorrhoids   . Colon polyps   . PREMATURE VENTRICULAR CONTRACTIONS 09/07/2009  . ICD-St.Jude 09/07/2009  . HYPERLIPIDEMIA 05/03/2009  . GOUT, UNSPECIFIED 05/03/2009  . UNSPECIFIED ESSENTIAL HYPERTENSION 05/03/2009  . CAD, ARTERY BYPASS GRAFT 05/03/2009  . CHF 05/03/2009  . CHRONIC KIDNEY DISEASE UNSPECIFIED 05/03/2009  . DEGENERATIVE JOINT DISEASE 05/03/2009  . ANEMIA, IRON DEFICIENCY, HX OF 05/03/2009   Outpatient Encounter Prescriptions as of 03/27/2013  Medication Sig Dispense Refill  . allopurinol (ZYLOPRIM) 100 MG tablet TAKE 1 TABLET DAILY  30 tablet  0  . aspirin EC 325 MG EC tablet Take 325 mg by mouth at bedtime.       . carvedilol (COREG) 12.5 MG tablet TAKE  (1)  TABLET TWICE A DAY.  180 tablet  3  . cholecalciferol (VITAMIN D) 1000 UNITS tablet Take 1,000 Units by mouth daily.       Marland Kitchen lisinopril (PRINIVIL,ZESTRIL) 10 MG tablet Take 10 mg by mouth daily.       Marland Kitchen LOVAZA 1 G capsule TAKE 4 CAPSULE ONCE DAILY OR AS DIRECTED  120 capsule  1  . multivitamin (THERAGRAN) per tablet Take 1 tablet by mouth daily.       . niacin (NIASPAN) 750 MG CR tablet TAKE 2 TABLETS AT BEDTIME  180 tablet  0  . pravastatin (PRAVACHOL) 40 MG tablet TAKE  (1)  TABLET TWICE A DAY.  60 tablet  0  . temazepam (RESTORIL) 30 MG capsule Take 1 capsule (30 mg total) by mouth at bedtime.  30  capsule  1  . traZODone (DESYREL) 50 MG tablet Take 50 mg by mouth at bedtime.       . [DISCONTINUED] HYDROcodone-acetaminophen (NORCO/VICODIN) 5-325 MG per tablet Take 1-2 tablets by mouth every 6 (six) hours as needed.  14 tablet  0   No facility-administered encounter medications on file as of 03/27/2013.     Review of Systems  Constitutional: Negative.   HENT: Negative.   Eyes: Negative.   Respiratory: Negative.   Cardiovascular: Negative.   Gastrointestinal: Negative.   Endocrine: Negative.   Genitourinary: Negative.   Musculoskeletal: Negative.   Skin: Negative.   Allergic/Immunologic: Negative.   Neurological: Negative.   Hematological: Negative.   Psychiatric/Behavioral: Negative.        Objective:   Physical Exam  Nursing note and vitals reviewed. Constitutional: He is oriented to person, place, and time. He appears well-developed and well-nourished.  HENT:  Head: Normocephalic and atraumatic.  Right Ear: External ear normal.  Left Ear: External ear normal.  Nose: Nose normal.  Mouth/Throat: Oropharynx is clear and moist. No oropharyngeal exudate.  Nasal pallor and congestion  Eyes: Conjunctivae and EOM are normal. Pupils are equal, round, and reactive to light. Right eye exhibits no discharge. Left eye exhibits no discharge. No scleral icterus.  Neck: Normal range  of motion. Neck supple. No thyromegaly present.  No carotid bruits  Cardiovascular: Normal rate, regular rhythm, normal heart sounds and intact distal pulses.  Exam reveals no gallop and no friction rub.   No murmur heard. At 60 per minute  Pulmonary/Chest: Effort normal and breath sounds normal. He has no wheezes. He has no rales. He exhibits no tenderness.  No axillary nodes or breast mass  Abdominal: Soft. Bowel sounds are normal. He exhibits no distension and no mass. There is no tenderness. There is no rebound and no guarding.  Genitourinary: Rectum normal and penis normal.  Prostate is slightly  enlarged without nodules or lumps. There were no rectal masses. There were no testicular masses. And there was no inguinal hernia .  Musculoskeletal: Normal range of motion. He exhibits no edema and no tenderness.  Lymphadenopathy:    He has no cervical adenopathy.  Neurological: He is alert and oriented to person, place, and time. He has normal reflexes. No cranial nerve deficit.  Patient had good pedal pulses and sensation was intact on both feet  Skin: Skin is warm and dry. No rash noted. No erythema. No pallor.  Psychiatric: He has a normal mood and affect. His behavior is normal. Judgment and thought content normal.    BP 124/80  Pulse 61  Temp(Src) 97.8 F (36.6 C) (Oral)  Ht 5\' 10"  (1.778 m)  Wt 185 lb (83.915 kg)  BMI 26.54 kg/m2       Assessment & Plan:   1. HYPERLIPIDEMIA   2. Gout, unspecified   3. Unspecified essential hypertension   4. CAD, ARTERY BYPASS GRAFT   5. CHF   6. DEGENERATIVE JOINT DISEASE   7. ANEMIA, IRON DEFICIENCY, HX OF   8. Unspecified vitamin D deficiency   9. BPH (benign prostatic hyperplasia)   10. Hx of CABG   11. ASCVD (arteriosclerotic cardiovascular disease)   12. Congestive heart failure, unspecified   13. Allergic rhinitis    Orders Placed This Encounter  Procedures  . Hepatic function panel  . BMP8+EGFR  . PSA, total and free  . NMR, lipoprofile  . Vit D  25 hydroxy (rtn osteoporosis monitoring)  . POCT CBC   Meds ordered this encounter  Medications  . allopurinol (ZYLOPRIM) 100 MG tablet    Sig: TAKE 1 TABLET DAILY    Dispense:  90 tablet    Refill:  3  . carvedilol (COREG) 12.5 MG tablet    Sig: TAKE  (1)  TABLET TWICE A DAY.    Dispense:  180 tablet    Refill:  3  . lisinopril (PRINIVIL,ZESTRIL) 10 MG tablet    Sig: Take 1 tablet (10 mg total) by mouth daily.    Dispense:  90 tablet    Refill:  3  . omega-3 acid ethyl esters (LOVAZA) 1 G capsule    Sig: TAKE 4 CAPSULE ONCE DAILY OR AS DIRECTED    Dispense:  360  capsule    Refill:  3  . niacin (NIASPAN) 750 MG CR tablet    Sig: TAKE 2 TABLETS AT BEDTIME    Dispense:  180 tablet    Refill:  3  . pravastatin (PRAVACHOL) 40 MG tablet    Sig: TAKE  (1)  TABLET TWICE A DAY.    Dispense:  180 tablet    Refill:  3    Must be seen before next refill  . temazepam (RESTORIL) 30 MG capsule    Sig: Take 1 capsule (30 mg  total) by mouth at bedtime.    Dispense:  90 capsule    Refill:  3  . traZODone (DESYREL) 50 MG tablet    Sig: Take 1 tablet (50 mg total) by mouth at bedtime.    Dispense:  90 tablet    Refill:  3  . fluticasone (FLONASE) 50 MCG/ACT nasal spray    Sig: Place 2 sprays into the nose daily.    Dispense:  48 g    Refill:  3   Patient Instructions  Continue current medications. Continue good therapeutic lifestyle changes.  Fall precautions discussed with patient. Follow up as planned and earlier as needed.  you will get your flu shot and Prevnar today. We will arrange to have appointment with Dr. Verdis Prime the cardiologist, for routine followup     Nyra Capes MD

## 2013-03-29 LAB — HEPATIC FUNCTION PANEL
AST: 21 IU/L (ref 0–40)
Albumin: 4.9 g/dL — ABNORMAL HIGH (ref 3.5–4.8)

## 2013-03-29 LAB — NMR, LIPOPROFILE
HDL Cholesterol by NMR: 42 mg/dL (ref >=40)
LDL Size: 20 nm — ABNORMAL LOW (ref >20.5)
Small LDL Particle Number: 1140 nmol/L — ABNORMAL HIGH (ref <=527)

## 2013-03-29 LAB — PSA, TOTAL AND FREE
PSA, Free Pct: 34.2 %
PSA, Free: 0.82 ng/mL

## 2013-03-29 LAB — BMP8+EGFR
BUN/Creatinine Ratio: 24 — ABNORMAL HIGH (ref 10–22)
BUN: 33 mg/dL — ABNORMAL HIGH (ref 8–27)
CO2: 26 mmol/L (ref 18–29)
Calcium: 10.2 mg/dL (ref 8.6–10.2)
Creatinine, Ser: 1.37 mg/dL — ABNORMAL HIGH (ref 0.76–1.27)
GFR calc non Af Amer: 51 mL/min/{1.73_m2} — ABNORMAL LOW (ref >59)

## 2013-03-29 LAB — VITAMIN D 25 HYDROXY (VIT D DEFICIENCY, FRACTURES): Vit D, 25-Hydroxy: 37.8 ng/mL (ref 30.0–100.0)

## 2013-03-31 ENCOUNTER — Other Ambulatory Visit: Payer: Self-pay | Admitting: Family Medicine

## 2013-04-17 ENCOUNTER — Ambulatory Visit: Payer: Medicare Other

## 2013-05-16 ENCOUNTER — Encounter: Payer: Self-pay | Admitting: Interventional Cardiology

## 2013-06-12 ENCOUNTER — Ambulatory Visit (INDEPENDENT_AMBULATORY_CARE_PROVIDER_SITE_OTHER): Payer: Medicare Other | Admitting: Nurse Practitioner

## 2013-06-12 ENCOUNTER — Encounter: Payer: Self-pay | Admitting: Nurse Practitioner

## 2013-06-12 VITALS — BP 116/74 | HR 87 | Temp 97.2°F | Ht 70.0 in | Wt 189.0 lb

## 2013-06-12 DIAGNOSIS — J0110 Acute frontal sinusitis, unspecified: Secondary | ICD-10-CM

## 2013-06-12 MED ORDER — AZITHROMYCIN 250 MG PO TABS: ORAL_TABLET | ORAL | Status: DC

## 2013-06-12 NOTE — Patient Instructions (Signed)

## 2013-06-12 NOTE — Progress Notes (Signed)
   Subjective:    Patient ID: [name], male    DOB: 1940/09/23, 72 y.o.   MRN: [id number]  Sinusitis This is a new problem. The current episode started in the past 7 days. The problem has been gradually worsening since onset. There has been no fever. His pain is at a severity of 0/10. Associated symptoms include congestion, coughing and sinus pressure. Pertinent negatives include no chills, diaphoresis, ear pain, shortness of breath, sneezing or sore throat. Past treatments include acetaminophen and oral decongestants. The treatment provided no relief.      Review of Systems  Constitutional: Negative for chills and diaphoresis.  HENT: Positive for congestion and sinus pressure. Negative for ear pain, sneezing and sore throat.   Respiratory: Positive for cough. Negative for shortness of breath.   All other systems reviewed and are negative.       Objective:   Physical Exam  Constitutional: He is oriented to person, place, and time. He appears well-developed and well-nourished. No distress.  HENT:  Right Ear: Hearing, tympanic membrane, external ear and ear canal normal.  Left Ear: Hearing, tympanic membrane, external ear and ear canal normal.  Nose: Mucosal edema and rhinorrhea present. Right sinus exhibits frontal sinus tenderness. Right sinus exhibits no maxillary sinus tenderness. Left sinus exhibits frontal sinus tenderness. Left sinus exhibits no maxillary sinus tenderness.  Mouth/Throat: Uvula is midline, oropharynx is clear and moist and mucous membranes are normal.  Eyes: Conjunctivae are normal. Pupils are equal, round, and reactive to light.  Neck: Normal range of motion. Neck supple.  Cardiovascular: Normal rate, regular rhythm and normal heart sounds.   Pulmonary/Chest: Effort normal and breath sounds normal.  Lymphadenopathy:    He has no cervical adenopathy.  Neurological: He is alert and oriented to person, place, and time.  Skin: Skin is warm. He is not  diaphoretic.  BP 116/74  Pulse 87  Temp(Src) 97.2 F (36.2 C) (Oral)  Ht 5\' 10"  (1.778 m)  Wt 189 lb (85.73 kg)  BMI 27.12 kg/m2         Assessment & Plan:   1. Acute frontal sinusitis    Meds ordered this encounter  Medications  . azithromycin (ZITHROMAX Z-PAK) 250 MG tablet    Sig: As directed    Dispense:  6 each    Refill:  0    Order Specific Question:  Supervising Provider    Answer:  [name] [[id number]]   1. Take meds as prescribed 2. Use a cool mist humidifier especially during the winter months and when heat has  been humid. 3. Use saline nose sprays frequently 4. Saline irrigations of the nose can be very helpful if done frequently.  * 4X daily for 1 week*  * Use of a nettie pot can be helpful with this. Follow directions with this* 5. Drink plenty of fluids 6. Keep thermostat turn down low 7.For any cough or congestion  Use plain Mucinex- regular strength or max strength is fine   * Children- consult with Pharmacist for dosing 8. For fever or aces or pains- take tylenol or ibuprofen appropriate for age and weight.  * for fevers greater than 101 orally you may alternate ibuprofen and tylenol every  3 hours.   Mary-Margaret Daphine Deutscher, FNP

## 2013-07-10 ENCOUNTER — Ambulatory Visit: Payer: Self-pay | Admitting: Family Medicine

## 2013-07-19 IMAGING — CR DG CHEST 1V PORT
1 series · 1 of 1 positions shown · non-contrast
Comparison: 04/29/2012

CLINICAL DATA: Status post ICD extraction

PORTABLE CHEST - 1 VIEW

[AP]
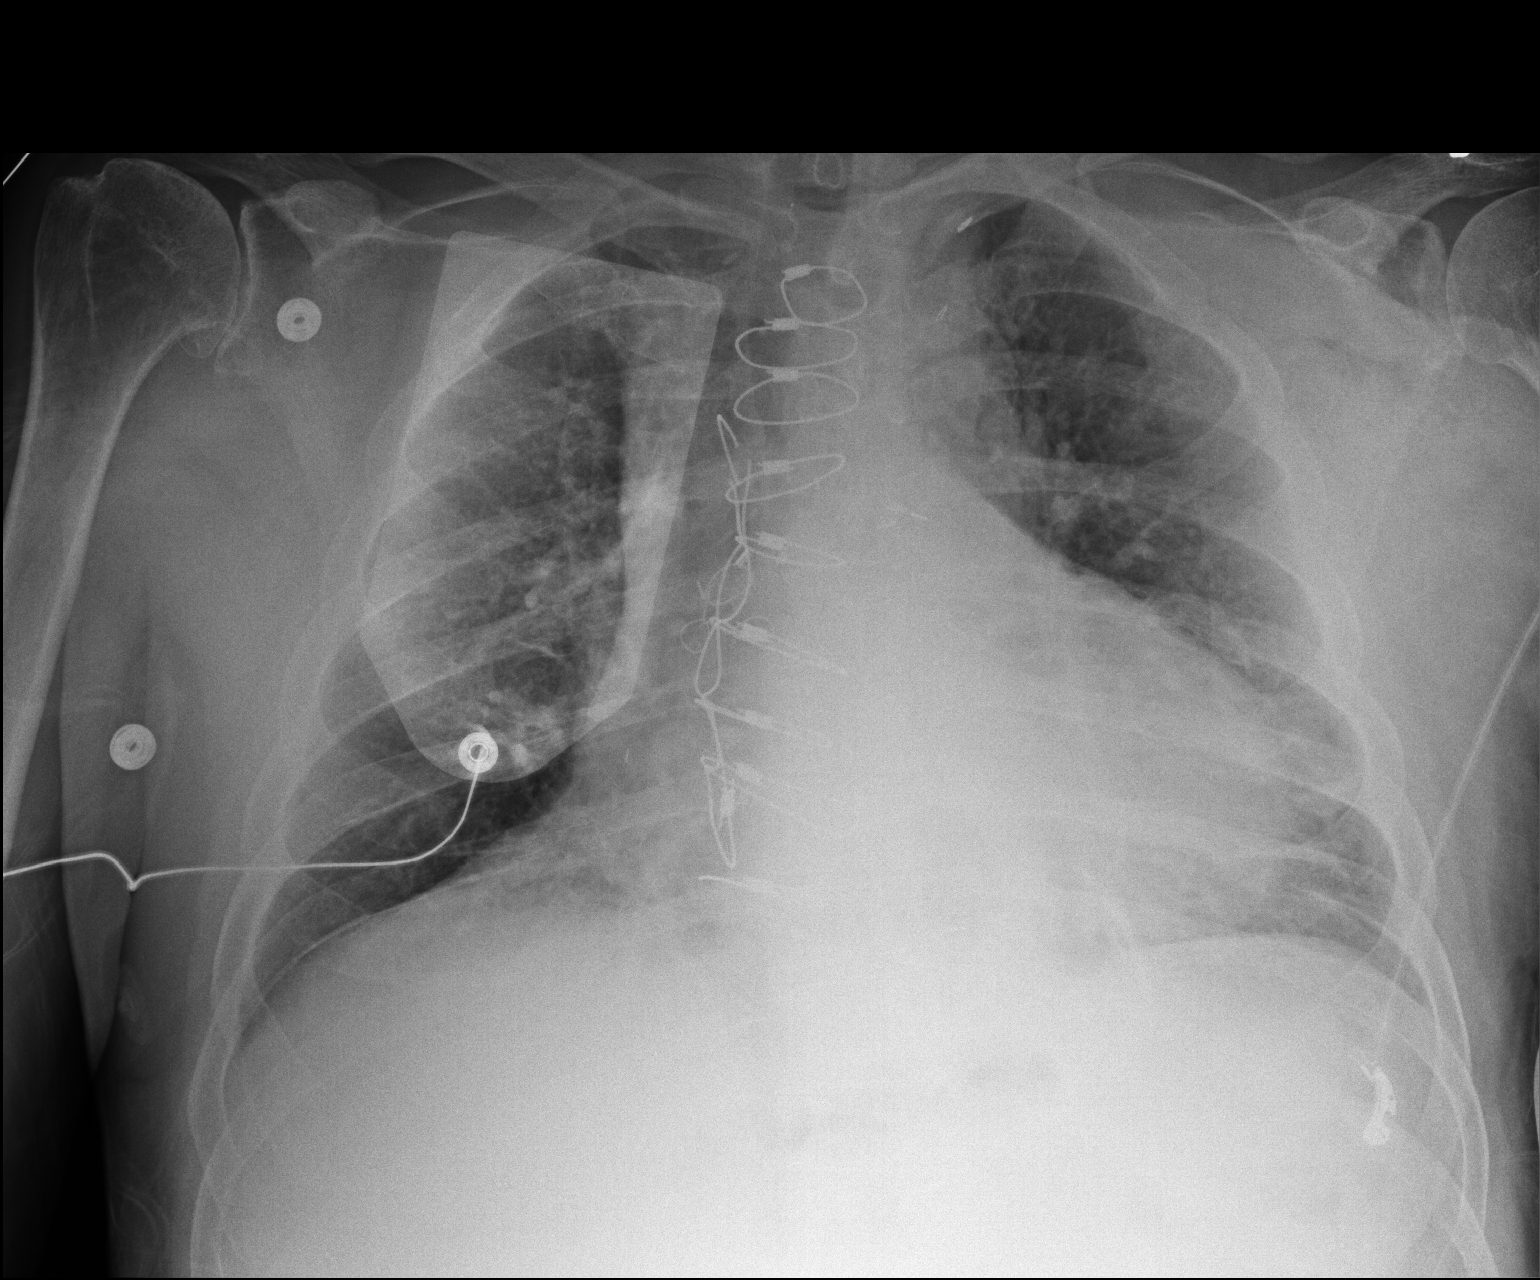

[1 of 1 positions shown; findings below may reference images not displayed]

FINDINGS: Previous median sternotomy CABG procedure.  Moderate
cardiac enlargement.  Interval removal of left chest wall AICD and
associated leads.  Atelectasis is noted in the left base.  There is
pulmonary venous congestion.  No pleural effusions identified or
edema.  No pneumothorax noted.  A small amount of gas is noted
within the soft tissues of the left upper chest.
IMPRESSION: 1.  Status post removal of left chest wall AICD and associated
leads.
2.  Cardiac enlargement and pulmonary venous congestion

## 2013-07-30 ENCOUNTER — Telehealth: Payer: Self-pay | Admitting: Family Medicine

## 2013-07-30 ENCOUNTER — Encounter: Payer: Self-pay | Admitting: Family Medicine

## 2013-07-30 ENCOUNTER — Ambulatory Visit (INDEPENDENT_AMBULATORY_CARE_PROVIDER_SITE_OTHER): Payer: Medicare Other | Admitting: Family Medicine

## 2013-07-30 VITALS — BP 121/77 | HR 62 | Temp 97.1°F | Ht 70.0 in | Wt 187.0 lb

## 2013-07-30 DIAGNOSIS — I25810 Atherosclerosis of coronary artery bypass graft(s) without angina pectoris: Secondary | ICD-10-CM

## 2013-07-30 DIAGNOSIS — K219 Gastro-esophageal reflux disease without esophagitis: Secondary | ICD-10-CM

## 2013-07-30 DIAGNOSIS — I2510 Atherosclerotic heart disease of native coronary artery without angina pectoris: Secondary | ICD-10-CM

## 2013-07-30 DIAGNOSIS — E559 Vitamin D deficiency, unspecified: Secondary | ICD-10-CM

## 2013-07-30 DIAGNOSIS — I7090 Unspecified atherosclerosis: Secondary | ICD-10-CM

## 2013-07-30 DIAGNOSIS — J310 Chronic rhinitis: Secondary | ICD-10-CM

## 2013-07-30 DIAGNOSIS — E785 Hyperlipidemia, unspecified: Secondary | ICD-10-CM

## 2013-07-30 DIAGNOSIS — K449 Diaphragmatic hernia without obstruction or gangrene: Secondary | ICD-10-CM

## 2013-07-30 DIAGNOSIS — N400 Enlarged prostate without lower urinary tract symptoms: Secondary | ICD-10-CM

## 2013-07-30 DIAGNOSIS — I10 Essential (primary) hypertension: Secondary | ICD-10-CM

## 2013-07-30 LAB — POCT CBC
Granulocyte percent: 57.4 %G (ref 37–80)
HEMATOCRIT: 45.5 % (ref 43.5–53.7)
Hemoglobin: 14.5 g/dL (ref 14.1–18.1)
Lymph, poc: 2.5 (ref 0.6–3.4)
MCH, POC: 31.1 pg (ref 27–31.2)
MCHC: 31.9 g/dL (ref 31.8–35.4)
MCV: 97.6 fL — AB (ref 80–97)
MPV: 6.2 fL (ref 0–99.8)
POC GRANULOCYTE: 3.7 (ref 2–6.9)
POC LYMPH PERCENT: 38.2 %L (ref 10–50)
Platelet Count, POC: 148 10*3/uL (ref 142–424)
RBC: 4.7 M/uL (ref 4.69–6.13)
RDW, POC: 14.5 %
WBC: 6.5 10*3/uL (ref 4.6–10.2)

## 2013-07-30 MED ORDER — AZELASTINE HCL 0.1 % NA SOLN: NASAL | Status: DC

## 2013-07-30 NOTE — Patient Instructions (Addendum)
Continue current medications. Continue good therapeutic lifestyle changes which include good diet and exercise. Fall precautions discussed with patient. Schedule your flu vaccine if you haven't had it yet If you are over 75 years old - you may need Prevnar 13 or the adult Pneumonia vaccine. Try to drink more fluid Use a cool mist humidifier in her bedroom at nighttime Use saline nose spray through the day,ayr use the additional prescription nose spray as directed Return the FOBT

## 2013-07-30 NOTE — Progress Notes (Signed)
Subjective:    Patient ID: [name], male    DOB: [birth year], 73 y.o.   MRN: [id number]  HPI Pt here for follow up and management of chronic medical problems. The patient has been doing well. He is followed by the cardiologist and the nephrologist. He will be given FOBT to return. He'll have lab work today. He will be due a Prevnar vaccine but this will be given at least a year after his Pneumovax. His biggest complaint today is that he has persistent nasal congestion. Patient comes to the visit today with his wife. He has an appointment with the cardiologist in March. He has already seen the nephrologist. He will be due his Prevnar after the month of February.       Patient Active Problem List   Diagnosis Date Noted  . BPH (benign prostatic hyperplasia) 03/27/2013  . Allergic rhinitis 03/27/2013  . ICD (implantable cardiac defibrillator) infection 04/16/2012  . PVC's (premature ventricular contractions)   . ASCVD (arteriosclerotic cardiovascular disease)   . MI, old   . Hx of CABG   . Essential hypertension, benign   . Gastroesophageal reflux disease with hiatal hernia   . Osteoarthrosis and allied disorders   . Impotence   . Hemorrhoids   . Colon polyps   . PREMATURE VENTRICULAR CONTRACTIONS 09/07/2009  . ICD-St.Jude 09/07/2009  . HYPERLIPIDEMIA 05/03/2009  . GOUT, UNSPECIFIED 05/03/2009  . Hypertension 05/03/2009  . CAD, ARTERY BYPASS GRAFT 05/03/2009  . CHF 05/03/2009  . CHRONIC KIDNEY DISEASE UNSPECIFIED 05/03/2009  . DEGENERATIVE JOINT DISEASE 05/03/2009  . ANEMIA, IRON DEFICIENCY, HX OF 05/03/2009   Outpatient Encounter Prescriptions as of 07/30/2013  Medication Sig  . allopurinol (ZYLOPRIM) 100 MG tablet TAKE 1 TABLET DAILY  . aspirin EC 325 MG EC tablet Take 325 mg by mouth at bedtime.   . carvedilol (COREG) 12.5 MG tablet TAKE  (1)  TABLET TWICE A DAY.  . cholecalciferol (VITAMIN D) 1000 UNITS tablet Take 1,000 Units by mouth daily.   . fluticasone  (FLONASE) 50 MCG/ACT nasal spray Place 2 sprays into the nose daily.  Marland Kitchen lisinopril (PRINIVIL,ZESTRIL) 10 MG tablet Take 1 tablet (10 mg total) by mouth daily.  . multivitamin (THERAGRAN) per tablet Take 1 tablet by mouth daily.   . niacin (NIASPAN) 750 MG CR tablet TAKE 2 TABLETS AT BEDTIME  . omega-3 acid ethyl esters (LOVAZA) 1 G capsule TAKE 4 CAPSULE ONCE DAILY OR AS DIRECTED  . pravastatin (PRAVACHOL) 40 MG tablet TAKE  (1)  TABLET TWICE A DAY.  Marland Kitchen temazepam (RESTORIL) 30 MG capsule Take 1 capsule (30 mg total) by mouth at bedtime.  . traZODone (DESYREL) 50 MG tablet Take 1 tablet (50 mg total) by mouth at bedtime.  . [DISCONTINUED] allopurinol (ZYLOPRIM) 100 MG tablet TAKE 1 TABLET DAILY  . [DISCONTINUED] azithromycin (ZITHROMAX Z-PAK) 250 MG tablet As directed    Review of Systems  Constitutional: Negative.   HENT: Positive for postnasal drip (clear).   Eyes: Negative.   Respiratory: Negative.   Cardiovascular: Negative.   Gastrointestinal: Negative.   Endocrine: Negative.   Genitourinary: Negative.   Musculoskeletal: Negative.   Skin: Negative.   Allergic/Immunologic: Negative.   Neurological: Negative.   Hematological: Negative.   Psychiatric/Behavioral: Negative.        Objective:   Physical Exam  Nursing note and vitals reviewed. Constitutional: He is oriented to person, place, and time. He appears well-developed and well-nourished. No distress.  HENT:  Head: Normocephalic and atraumatic.  Right Ear: External ear normal.  Left Ear: External ear normal.  Mouth/Throat: Oropharynx is clear and moist. No oropharyngeal exudate.  Nasal turbinate congestion bilaterally  Eyes: Conjunctivae and EOM are normal. Pupils are equal, round, and reactive to light. Right eye exhibits no discharge. Left eye exhibits no discharge. No scleral icterus.  Neck: Normal range of motion. Neck supple. No tracheal deviation present. No thyromegaly present.  No carotid bruits  Cardiovascular:  Normal rate, regular rhythm, normal heart sounds and intact distal pulses.  Exam reveals no gallop and no friction rub.   No murmur heard. Regular rhythm .At 84 per minute  Pulmonary/Chest: Effort normal and breath sounds normal. No respiratory distress. He has no wheezes. He has no rales. He exhibits no tenderness.  Abdominal: Soft. Bowel sounds are normal. He exhibits no mass. There is no tenderness. There is no rebound and no guarding.  Musculoskeletal: Normal range of motion. He exhibits no edema and no tenderness.  Lymphadenopathy:    He has no cervical adenopathy.  Neurological: He is alert and oriented to person, place, and time. He has normal reflexes. No cranial nerve deficit.  Skin: Skin is warm and dry. No rash noted. No erythema. No pallor.  Psychiatric: He has a normal mood and affect. His behavior is normal. Judgment and thought content normal.   BP 121/77  Pulse 62  Temp(Src) 97.1 F (36.2 C) (Oral)  Ht $R'5\' 10"'XF$  (1.778 m)  Wt 187 lb (84.823 kg)  BMI 26.83 kg/m2        Assessment & Plan:  1. ASCVD (arteriosclerotic cardiovascular disease) -Continue followup with Dr. Daneen Schick and Dr. Crissie Sickles - POCT CBC  2. BPH (benign prostatic hyperplasia) - POCT CBC  3. CAD, ARTERY BYPASS GRAFT - POCT CBC - BMP8+EGFR  4. Essential hypertension, benign - POCT CBC - BMP8+EGFR - Hepatic function panel  5. Gastroesophageal reflux disease with hiatal hernia - POCT CBC  6. HYPERLIPIDEMIA - POCT CBC - NMR, lipoprofile  7. Vitamin D deficiency - Vit D  25 hydroxy (rtn osteoporosis monitoring)  8. Rhinitis - azelastine (ASTELIN) 137 MCG/SPRAY nasal spray; 1 spray each nostril daily at bedtime  Dispense: 30 mL; Refill: 12  9. History of chronic kidney disease -Continue followup with the nephrologist  Meds ordered this encounter  Medications  . azelastine (ASTELIN) 137 MCG/SPRAY nasal spray    Sig: 1 spray each nostril daily at bedtime    Dispense:  30 mL     Refill:  12   Patient Instructions  Continue current medications. Continue good therapeutic lifestyle changes which include good diet and exercise. Fall precautions discussed with patient. Schedule your flu vaccine if you haven't had it yet If you are over 25 years old - you may need Prevnar 84 or the adult Pneumonia vaccine. Try to drink more fluid Use a cool mist humidifier in her bedroom at nighttime Use saline nose spray through the day,ayr use the additional prescription nose spray as directed Return the FOBT   Arrie Senate MD

## 2013-07-31 LAB — BMP8+EGFR
BUN / CREAT RATIO: 21 (ref 10–22)
BUN: 29 mg/dL — ABNORMAL HIGH (ref 8–27)
CO2: 25 mmol/L (ref 18–29)
CREATININE: 1.39 mg/dL — AB (ref 0.76–1.27)
Calcium: 10.2 mg/dL (ref 8.6–10.2)
Chloride: 102 mmol/L (ref 97–108)
GFR calc Af Amer: 57 mL/min/{1.73_m2} — ABNORMAL LOW (ref >59)
GFR calc non Af Amer: 50 mL/min/{1.73_m2} — ABNORMAL LOW (ref >59)
GLUCOSE: 87 mg/dL (ref 65–99)
Potassium: 4.7 mmol/L (ref 3.5–5.2)
Sodium: 143 mmol/L (ref 134–144)

## 2013-07-31 LAB — NMR, LIPOPROFILE
CHOLESTEROL: 144 mg/dL (ref <200)
HDL Cholesterol by NMR: 48 mg/dL (ref >=40)
HDL PARTICLE NUMBER: 31.1 umol/L (ref >=30.5)
LDL Particle Number: 1167 nmol/L — ABNORMAL HIGH (ref <1000)
LDL Size: 20.5 nm — ABNORMAL LOW (ref >20.5)
LDLC SERPL CALC-MCNC: 82 mg/dL (ref <100)
LP-IR Score: 60 — ABNORMAL HIGH (ref <=45)
SMALL LDL PARTICLE NUMBER: 643 nmol/L — AB (ref <=527)
TRIGLYCERIDES BY NMR: 72 mg/dL (ref <150)

## 2013-07-31 LAB — HEPATIC FUNCTION PANEL
ALBUMIN: 4.7 g/dL (ref 3.5–4.8)
ALT: 13 IU/L (ref 0–44)
AST: 18 IU/L (ref 0–40)
Alkaline Phosphatase: 56 IU/L (ref 39–117)
BILIRUBIN TOTAL: 0.3 mg/dL (ref 0.0–1.2)
Bilirubin, Direct: 0.1 mg/dL (ref 0.00–0.40)
Total Protein: 7.3 g/dL (ref 6.0–8.5)

## 2013-07-31 LAB — VITAMIN D 25 HYDROXY (VIT D DEFICIENCY, FRACTURES): Vit D, 25-Hydroxy: 35.1 ng/mL (ref 30.0–100.0)

## 2013-07-31 NOTE — Telephone Encounter (Signed)
Astelin spray sent in. Pt aware.

## 2013-08-26 ENCOUNTER — Ambulatory Visit (INDEPENDENT_AMBULATORY_CARE_PROVIDER_SITE_OTHER): Payer: Medicare Other | Admitting: Family Medicine

## 2013-08-26 ENCOUNTER — Ambulatory Visit (INDEPENDENT_AMBULATORY_CARE_PROVIDER_SITE_OTHER): Payer: Medicare Other

## 2013-08-26 ENCOUNTER — Encounter: Payer: Self-pay | Admitting: Family Medicine

## 2013-08-26 VITALS — BP 144/103 | HR 76 | Temp 98.6°F | Wt 194.0 lb

## 2013-08-26 DIAGNOSIS — R079 Chest pain, unspecified: Secondary | ICD-10-CM

## 2013-08-26 DIAGNOSIS — S2232XA Fracture of one rib, left side, initial encounter for closed fracture: Secondary | ICD-10-CM | POA: Insufficient documentation

## 2013-08-26 DIAGNOSIS — R0781 Pleurodynia: Secondary | ICD-10-CM

## 2013-08-26 DIAGNOSIS — S2239XA Fracture of one rib, unspecified side, initial encounter for closed fracture: Secondary | ICD-10-CM

## 2013-08-26 DIAGNOSIS — I25810 Atherosclerosis of coronary artery bypass graft(s) without angina pectoris: Secondary | ICD-10-CM
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
## 2013-09-02 ENCOUNTER — Ambulatory Visit: Payer: Medicare Other | Admitting: Family Medicine

## 2013-09-02 ENCOUNTER — Ambulatory Visit: Payer: BLUE CROSS/BLUE SHIELD | Admitting: Interventional Cardiology

## 2013-09-04 ENCOUNTER — Ambulatory Visit (INDEPENDENT_AMBULATORY_CARE_PROVIDER_SITE_OTHER): Payer: Medicare Other | Admitting: Interventional Cardiology

## 2013-09-04 ENCOUNTER — Encounter: Payer: Self-pay | Admitting: Interventional Cardiology

## 2013-09-04 VITALS — BP 100/70 | HR 68 | Ht 70.0 in | Wt 194.0 lb

## 2013-09-04 DIAGNOSIS — I4949 Other premature depolarization: Secondary | ICD-10-CM

## 2013-09-04 DIAGNOSIS — I5022 Chronic systolic (congestive) heart failure: Secondary | ICD-10-CM

## 2013-09-04 DIAGNOSIS — I2510 Atherosclerotic heart disease of native coronary artery without angina pectoris: Secondary | ICD-10-CM

## 2013-09-04 DIAGNOSIS — N189 Chronic kidney disease, unspecified: Secondary | ICD-10-CM

## 2013-09-04 DIAGNOSIS — T827XXA Infection and inflammatory reaction due to other cardiac and vascular devices, implants and grafts, initial encounter: Secondary | ICD-10-CM

## 2013-09-04 DIAGNOSIS — E785 Hyperlipidemia, unspecified: Secondary | ICD-10-CM

## 2013-09-04 DIAGNOSIS — I10 Essential (primary) hypertension: Secondary | ICD-10-CM

## 2013-09-04 DIAGNOSIS — I493 Ventricular premature depolarization: Secondary | ICD-10-CM
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
## 2013-09-04 NOTE — Patient Instructions (Signed)
Your physician recommends that you continue on your current medications as directed. Please refer to the Current Medication list given to you today.  Your physician wants you to follow-up in: 1 year. You will receive a reminder letter in the mail two months in advance. If you don't receive a letter, please call our office to schedule the follow-up appointment.  

## 2013-10-01 ENCOUNTER — Other Ambulatory Visit: Payer: Self-pay | Admitting: Family Medicine

## 2013-12-02 ENCOUNTER — Ambulatory Visit (INDEPENDENT_AMBULATORY_CARE_PROVIDER_SITE_OTHER): Payer: Medicare Other | Admitting: Family Medicine

## 2013-12-02 ENCOUNTER — Encounter: Payer: Self-pay | Admitting: Family Medicine

## 2013-12-02 VITALS — BP 125/82 | HR 66 | Temp 97.0°F | Ht 70.0 in | Wt 190.0 lb

## 2013-12-02 DIAGNOSIS — Z862 Personal history of diseases of the blood and blood-forming organs and certain disorders involving the immune mechanism: Secondary | ICD-10-CM

## 2013-12-02 DIAGNOSIS — I10 Essential (primary) hypertension: Secondary | ICD-10-CM

## 2013-12-02 DIAGNOSIS — N189 Chronic kidney disease, unspecified: Secondary | ICD-10-CM

## 2013-12-02 DIAGNOSIS — R439 Unspecified disturbances of smell and taste: Secondary | ICD-10-CM

## 2013-12-02 DIAGNOSIS — N400 Enlarged prostate without lower urinary tract symptoms: Secondary | ICD-10-CM

## 2013-12-02 DIAGNOSIS — R430 Anosmia: Secondary | ICD-10-CM

## 2013-12-02 DIAGNOSIS — E559 Vitamin D deficiency, unspecified: Secondary | ICD-10-CM

## 2013-12-02 DIAGNOSIS — E785 Hyperlipidemia, unspecified: Secondary | ICD-10-CM

## 2013-12-02 DIAGNOSIS — I25810 Atherosclerosis of coronary artery bypass graft(s) without angina pectoris: Secondary | ICD-10-CM

## 2013-12-02 DIAGNOSIS — L989 Disorder of the skin and subcutaneous tissue, unspecified: Secondary | ICD-10-CM

## 2013-12-02 DIAGNOSIS — I2510 Atherosclerotic heart disease of native coronary artery without angina pectoris: Secondary | ICD-10-CM

## 2013-12-02 LAB — POCT CBC
Granulocyte percent: 52.2 %G (ref 37–80)
HEMATOCRIT: 40.4 % — AB (ref 43.5–53.7)
Hemoglobin: 13.4 g/dL — AB (ref 14.1–18.1)
Lymph, poc: 2.5 (ref 0.6–3.4)
MCH, POC: 31.8 pg — AB (ref 27–31.2)
MCHC: 33.3 g/dL (ref 31.8–35.4)
MCV: 95.7 fL (ref 80–97)
MPV: 5.9 fL (ref 0–99.8)
POC GRANULOCYTE: 3.4 (ref 2–6.9)
POC LYMPH %: 38.3 % (ref 10–50)
Platelet Count, POC: 167 10*3/uL (ref 142–424)
RBC: 4.2 M/uL — AB (ref 4.69–6.13)
RDW, POC: 13.5 %
WBC: 6.5 10*3/uL (ref 4.6–10.2)

## 2013-12-02 NOTE — Patient Instructions (Addendum)
Medicare Annual Wellness Visit  [place] and the medical providers at [facility] strive to bring you the best medical care.  In doing so we not only want to address your current medical conditions and concerns but also to detect new conditions early and prevent illness, disease and health-related problems.    Medicare offers a yearly Wellness Visit which allows our clinical staff to assess your need for preventative services including immunizations, lifestyle education, counseling to decrease risk of preventable diseases and screening for fall risk and other medical concerns.    This visit is provided free of charge (no copay) for all Medicare recipients. The clinical pharmacists at [facility] have begun to conduct these Wellness Visits which will also include a thorough review of all your medications.    As you primary medical provider recommend that you make an appointment for your Annual Wellness Visit if you have not done so already this year.  You may set up this appointment before you leave today or you may call back [phone]) and schedule an appointment.  Please make sure when you call that you mention that you are scheduling your Annual Wellness Visit with the clinical pharmacist so that the appointment may be made for the proper length of time.      Continue current medications. Continue good therapeutic lifestyle changes which include good diet and exercise. Fall precautions discussed with patient. If an FOBT was given today- please return it to our front desk. If you are over 45 years old - you may need Prevnar 13 or the adult Pneumonia vaccine.  We will arrange for you to see the neurologist regarding your lack of smell and taste We will also arrange for you to see the dermatologist regarding the lesion on your left leg

## 2013-12-02 NOTE — Progress Notes (Signed)
Subjective:    Patient ID: [name], male    DOB: December 29, 1940, 73 y.o.   MRN: [id number]  HPI Pt here for follow up and management of chronic medical problems. The patient complains of a decreased sense of smell and taste. The patient sees his cardiologist about once a year. He has seen him recently. He also sees the nephrologist every 3-4 months. He complains today of a left leg lesion that has been there for several months.         Patient Active Problem List   Diagnosis Date Noted  . Coronary atherosclerosis of native coronary artery 09/04/2013  . Fracture of rib of left side 08/26/2013  . Rib pain 08/26/2013  . BPH (benign prostatic hyperplasia) 03/27/2013  . Allergic rhinitis 03/27/2013  . Infection involving implantable cardioverter-defibrillator 04/16/2012  . PVC's (premature ventricular contractions)   . MI, old   . Essential hypertension, benign   . Gastroesophageal reflux disease with hiatal hernia   . Osteoarthrosis and allied disorders   . Impotence   . Hemorrhoids   . Colon polyps   . HYPERLIPIDEMIA 05/03/2009  . GOUT, UNSPECIFIED 05/03/2009  . Hypertension 05/03/2009  . CAD, ARTERY BYPASS GRAFT 05/03/2009  . Chronic systolic heart failure 28/76/8115  . CHRONIC KIDNEY DISEASE UNSPECIFIED 05/03/2009  . DEGENERATIVE JOINT DISEASE 05/03/2009  . ANEMIA, IRON DEFICIENCY, HX OF 05/03/2009   Outpatient Encounter Prescriptions as of 12/02/2013  Medication Sig  . allopurinol (ZYLOPRIM) 100 MG tablet TAKE 1 TABLET DAILY  . aspirin EC 325 MG EC tablet Take 325 mg by mouth at bedtime.   . carvedilol (COREG) 12.5 MG tablet TAKE  (1)  TABLET TWICE A DAY.  . cholecalciferol (VITAMIN D) 1000 UNITS tablet Take 1,000 Units by mouth daily.   Marland Kitchen lisinopril (PRINIVIL,ZESTRIL) 10 MG tablet Take 1 tablet (10 mg total) by mouth daily.  . multivitamin (THERAGRAN) per tablet Take 1 tablet by mouth daily.   . niacin (NIASPAN) 750 MG CR tablet TAKE 2 TABLETS AT BEDTIME  . omega-3  acid ethyl esters (LOVAZA) 1 G capsule TAKE 4 CAPSULE ONCE DAILY OR AS DIRECTED  . pravastatin (PRAVACHOL) 40 MG tablet TAKE  (1)  TABLET TWICE A DAY.  Marland Kitchen temazepam (RESTORIL) 30 MG capsule TAKE 1 CAPSULE AT BEDTIME  . traZODone (DESYREL) 50 MG tablet Take 1 tablet (50 mg total) by mouth at bedtime.  . fluticasone (FLONASE) 50 MCG/ACT nasal spray Place 2 sprays into the nose daily.  . [DISCONTINUED] azelastine (ASTELIN) 137 MCG/SPRAY nasal spray 1 spray each nostril daily at bedtime    Review of Systems  Constitutional: Negative.   HENT: Negative.        Lost sense of smell and taste  Eyes: Negative.   Respiratory: Negative.   Cardiovascular: Negative.   Gastrointestinal: Negative.   Endocrine: Negative.   Genitourinary: Negative.   Musculoskeletal: Negative.   Skin: Negative.   Allergic/Immunologic: Negative.   Neurological: Negative.   Hematological: Negative.   Psychiatric/Behavioral: Negative.        Objective:   Physical Exam  Nursing note and vitals reviewed. Constitutional: He is oriented to person, place, and time. He appears well-developed and well-nourished. No distress.  HENT:  Head: Normocephalic and atraumatic.  Right Ear: External ear normal.  Left Ear: External ear normal.  Nose: Nose normal.  Mouth/Throat: Oropharynx is clear and moist. No oropharyngeal exudate.  Eyes: Conjunctivae and EOM are normal. Pupils are equal, round, and reactive to light. Right eye exhibits no  discharge. Left eye exhibits no discharge. No scleral icterus.  the discs were sharp bilaterally  Neck: Normal range of motion. Neck supple. No thyromegaly present.  No carotid bruits were audible  Cardiovascular: Normal rate, regular rhythm, normal heart sounds and intact distal pulses.  Exam reveals no gallop and no friction rub.   No murmur heard. At 60 per minute  Pulmonary/Chest: Effort normal and breath sounds normal. No respiratory distress. He has no wheezes. He has no rales. He  exhibits no tenderness.  No axillary adenopathy  Abdominal: Soft. Bowel sounds are normal. He exhibits no mass. There is no tenderness. There is no rebound and no guarding.  No inguinal adenopathy  Musculoskeletal: Normal range of motion. He exhibits no edema and no tenderness.  Lymphadenopathy:    He has no cervical adenopathy.  Neurological: He is alert and oriented to person, place, and time. He has normal reflexes. No cranial nerve deficit.  Skin: Skin is warm and dry. Rash noted. No erythema. No pallor.  There is a 1-1/2 inch diameter scaly lesion and the scar from his bypass surgery on the left anterior leg  Psychiatric: He has a normal mood and affect. His behavior is normal. Judgment and thought content normal.   BP 125/82  Pulse 66  Temp(Src) 97 F (36.1 C) (Oral)  Ht 5' 10" (1.778 m)  Wt 190 lb (86.183 kg)  BMI 27.26 kg/m2        Assessment & Plan:  1. CAD, ARTERY BYPASS GRAFT - POCT CBC  2. BPH (benign prostatic hyperplasia) - POCT CBC  3. ANEMIA, IRON DEFICIENCY, HX OF - POCT CBC  4. CHRONIC KIDNEY DISEASE UNSPECIFIED - POCT CBC  5. HYPERLIPIDEMIA - POCT CBC - NMR, lipoprofile  6. Hypertension - POCT CBC - BMP8+EGFR - Hepatic function panel  7. Vitamin D deficiency - Vit D  25 hydroxy (rtn osteoporosis monitoring)  8. Skin lesion of left leg - Ambulatory referral to Dermatology  9. Loss, sense of, smell and taste - Ambulatory referral to Neurology  Patient Instructions                       Medicare Annual Wellness Visit  [facility] and the medical providers at [facility] strive to bring you the best medical care.  In doing so we not only want to address your current medical conditions and concerns but also to detect new conditions early and prevent illness, disease and health-related problems.    Medicare offers a yearly Wellness Visit which allows our clinical staff to assess your need for preventative services  including immunizations, lifestyle education, counseling to decrease risk of preventable diseases and screening for fall risk and other medical concerns.    This visit is provided free of charge (no copay) for all Medicare recipients. The clinical pharmacists at [facility] have begun to conduct these Wellness Visits which will also include a thorough review of all your medications.    As you primary medical provider recommend that you make an appointment for your Annual Wellness Visit if you have not done so already this year.  You may set up this appointment before you leave today or you may call back ([phone]) and schedule an appointment.  Please make sure when you call that you mention that you are scheduling your Annual Wellness Visit with the clinical pharmacist so that the appointment may be made for the proper length of time.  Continue current medications. Continue good therapeutic lifestyle changes which include good diet and exercise. Fall precautions discussed with patient. If an FOBT was given today- please return it to our front desk. If you are over 54 years old - you may need Prevnar 18 or the adult Pneumonia vaccine.  We will arrange for you to see the neurologist regarding your lack of smell and taste We will also arrange for you to see the dermatologist regarding the lesion on your left leg   Return the FOBT   Arrie Senate MD

## 2013-12-03 ENCOUNTER — Telehealth: Payer: Self-pay | Admitting: Family Medicine

## 2013-12-03 LAB — BMP8+EGFR
BUN/Creatinine Ratio: 17 (ref 10–22)
BUN: 25 mg/dL (ref 8–27)
CALCIUM: 9.9 mg/dL (ref 8.6–10.2)
CO2: 26 mmol/L (ref 18–29)
CREATININE: 1.48 mg/dL — AB (ref 0.76–1.27)
Chloride: 105 mmol/L (ref 97–108)
GFR calc Af Amer: 53 mL/min/{1.73_m2} — ABNORMAL LOW (ref >59)
GFR, EST NON AFRICAN AMERICAN: 46 mL/min/{1.73_m2} — AB (ref >59)
GLUCOSE: 101 mg/dL — AB (ref 65–99)
Potassium: 5.1 mmol/L (ref 3.5–5.2)
Sodium: 143 mmol/L (ref 134–144)

## 2013-12-03 LAB — NMR, LIPOPROFILE
Cholesterol: 139 mg/dL (ref 100–199)
HDL Cholesterol by NMR: 40 mg/dL (ref >39)
HDL Particle Number: 24.4 umol/L — ABNORMAL LOW (ref >=30.5)
LDL PARTICLE NUMBER: 1112 nmol/L — AB (ref <1000)
LDL SIZE: 20.5 nm (ref >20.5)
LDLC SERPL CALC-MCNC: 85 mg/dL (ref 0–99)
LP-IR Score: 53 — ABNORMAL HIGH (ref <=45)
SMALL LDL PARTICLE NUMBER: 618 nmol/L — AB (ref <=527)
TRIGLYCERIDES BY NMR: 71 mg/dL (ref 0–149)

## 2013-12-03 LAB — HEPATIC FUNCTION PANEL
ALT: 13 IU/L (ref 0–44)
AST: 18 IU/L (ref 0–40)
Albumin: 4.5 g/dL (ref 3.5–4.8)
Alkaline Phosphatase: 51 IU/L (ref 39–117)
BILIRUBIN DIRECT: 0.12 mg/dL (ref 0.00–0.40)
BILIRUBIN TOTAL: 0.4 mg/dL (ref 0.0–1.2)
Total Protein: 6.5 g/dL (ref 6.0–8.5)

## 2013-12-03 LAB — VITAMIN D 25 HYDROXY (VIT D DEFICIENCY, FRACTURES): Vit D, 25-Hydroxy: 30.9 ng/mL (ref 30.0–100.0)

## 2013-12-03 NOTE — Telephone Encounter (Signed)
Message copied by Azalee Course on Wed Dec 03, 2013 10:08 AM ------      Message from: [name]      Created: Wed Dec 03, 2013  7:56 AM       The blood sugar was slightly elevated at 101. The creatinine the most important kidney function test is slightly more elevated than 4 months ago at 1.48. Remember to avoid nsaids. The electrolytes including potassium are within normal limit      Liver function tests are within normal      The total LDL particle number is slightly elevated at 1112. The LDL C. is also slightly elevated at 85 and should be less than 70. The triglycerides are good. The good cholesterol or the HDL particle number is low.------ continue pravastatin and as aggressive therapeutic lifestyle changes as possible      The vitamin D level is at the low in the normal range, continue current treatment      Please send a copy of this lab work to Dr. Abel Presto, his nephrologist ------

## 2013-12-04 ENCOUNTER — Other Ambulatory Visit: Payer: Self-pay | Admitting: Dermatology

## 2013-12-09 ENCOUNTER — Ambulatory Visit: Payer: Medicare Other | Admitting: Neurology

## 2013-12-09 ENCOUNTER — Telehealth: Payer: Self-pay | Admitting: Neurology

## 2013-12-09 NOTE — Telephone Encounter (Signed)
This patient rescheduled his appointment within 30 minutes of the appointment time.

## 2014-03-30 ENCOUNTER — Other Ambulatory Visit: Payer: Self-pay | Admitting: Family Medicine

## 2014-03-31 NOTE — Telephone Encounter (Signed)
Both of these prescriptions are okay to refill 

## 2014-04-01 ENCOUNTER — Other Ambulatory Visit: Payer: Self-pay | Admitting: Family Medicine

## 2014-04-01 NOTE — Telephone Encounter (Signed)
Script called to pharmacy

## 2014-04-02 ENCOUNTER — Other Ambulatory Visit: Payer: Self-pay | Admitting: Family Medicine

## 2014-04-08 ENCOUNTER — Other Ambulatory Visit: Payer: Self-pay | Admitting: *Deleted

## 2014-04-08 ENCOUNTER — Ambulatory Visit (INDEPENDENT_AMBULATORY_CARE_PROVIDER_SITE_OTHER): Payer: Medicare Other

## 2014-04-08 DIAGNOSIS — Z23 Encounter for immunization: Secondary | ICD-10-CM

## 2014-04-16 ENCOUNTER — Ambulatory Visit (INDEPENDENT_AMBULATORY_CARE_PROVIDER_SITE_OTHER): Payer: Medicare Other | Admitting: Family Medicine

## 2014-04-16 ENCOUNTER — Ambulatory Visit (INDEPENDENT_AMBULATORY_CARE_PROVIDER_SITE_OTHER): Payer: Medicare Other

## 2014-04-16 ENCOUNTER — Encounter: Payer: Self-pay | Admitting: Family Medicine

## 2014-04-16 VITALS — BP 131/89 | HR 69 | Temp 96.9°F | Ht 70.0 in | Wt 190.0 lb

## 2014-04-16 DIAGNOSIS — I10 Essential (primary) hypertension: Secondary | ICD-10-CM

## 2014-04-16 DIAGNOSIS — Z862 Personal history of diseases of the blood and blood-forming organs and certain disorders involving the immune mechanism: Secondary | ICD-10-CM

## 2014-04-16 DIAGNOSIS — E790 Hyperuricemia without signs of inflammatory arthritis and tophaceous disease: Secondary | ICD-10-CM | POA: Insufficient documentation

## 2014-04-16 DIAGNOSIS — I2510 Atherosclerotic heart disease of native coronary artery without angina pectoris: Secondary | ICD-10-CM

## 2014-04-16 DIAGNOSIS — I700 Atherosclerosis of aorta: Secondary | ICD-10-CM

## 2014-04-16 DIAGNOSIS — M109 Gout, unspecified: Secondary | ICD-10-CM

## 2014-04-16 DIAGNOSIS — E559 Vitamin D deficiency, unspecified: Secondary | ICD-10-CM

## 2014-04-16 DIAGNOSIS — N400 Enlarged prostate without lower urinary tract symptoms: Secondary | ICD-10-CM

## 2014-04-16 DIAGNOSIS — N183 Chronic kidney disease, stage 3 unspecified: Secondary | ICD-10-CM

## 2014-04-16 DIAGNOSIS — E785 Hyperlipidemia, unspecified: Secondary | ICD-10-CM

## 2014-04-16 LAB — POCT URINALYSIS DIPSTICK
Bilirubin, UA: NEGATIVE
Glucose, UA: NEGATIVE
KETONES UA: NEGATIVE
LEUKOCYTES UA: NEGATIVE
Nitrite, UA: NEGATIVE
PH UA: 5
Protein, UA: NEGATIVE
Spec Grav, UA: >=1.03
Urobilinogen, UA: NEGATIVE

## 2014-04-16 LAB — POCT CBC
Granulocyte percent: 49.8 %G (ref 37–80)
HCT, POC: 43.8 % (ref 43.5–53.7)
Hemoglobin: 14.2 g/dL (ref 14.1–18.1)
Lymph, poc: 2.4 (ref 0.6–3.4)
MCH, POC: 31.4 pg — AB (ref 27–31.2)
MCHC: 32.4 g/dL (ref 31.8–35.4)
MCV: 97 fL (ref 80–97)
MPV: 6 fL (ref 0–99.8)
POC Granulocyte: 2.7 (ref 2–6.9)
POC LYMPH PERCENT: 44.1 %L (ref 10–50)
Platelet Count, POC: 175 10*3/uL (ref 142–424)
RBC: 4.5 M/uL — AB (ref 4.69–6.13)
RDW, POC: 14.4 %
WBC: 5.5 10*3/uL (ref 4.6–10.2)

## 2014-04-16 LAB — POCT UA - MICROSCOPIC ONLY
Bacteria, U Microscopic: NEGATIVE
Casts, Ur, LPF, POC: NEGATIVE
Crystals, Ur, HPF, POC: NEGATIVE
Mucus, UA: NEGATIVE
Yeast, UA: NEGATIVE

## 2014-04-16 MED ORDER — OMEGA-3-ACID ETHYL ESTERS 1 G PO CAPS: ORAL_CAPSULE | ORAL | Status: AC

## 2014-04-16 MED ORDER — ALLOPURINOL 100 MG PO TABS: ORAL_TABLET | ORAL | Status: AC

## 2014-04-16 MED ORDER — FLUTICASONE PROPIONATE 50 MCG/ACT NA SUSP: 2.0000 | Freq: Every day | NASAL | Status: AC

## 2014-04-16 MED ORDER — LISINOPRIL 10 MG PO TABS: ORAL_TABLET | ORAL | Status: AC

## 2014-04-16 MED ORDER — NIACIN ER (ANTIHYPERLIPIDEMIC) 750 MG PO TBCR: EXTENDED_RELEASE_TABLET | ORAL | Status: AC

## 2014-04-16 MED ORDER — CARVEDILOL 12.5 MG PO TABS: ORAL_TABLET | ORAL | Status: AC

## 2014-04-16 MED ORDER — TRAZODONE HCL 50 MG PO TABS: 50.0000 mg | ORAL_TABLET | Freq: Every day | ORAL | Status: AC

## 2014-04-16 MED ORDER — TEMAZEPAM 30 MG PO CAPS: ORAL_CAPSULE | ORAL | Status: AC

## 2014-04-16 MED ORDER — PRAVASTATIN SODIUM 40 MG PO TABS: ORAL_TABLET | ORAL | Status: AC

## 2014-04-16 NOTE — Patient Instructions (Signed)
Continue current medication Stay active physically and to not put yourself at risk for falling Continue regular visits with cardiology and nephrology We will call you with the results of the lab work as soon as those results are unavailable                        Medicare Annual Wellness Visit  [place] and the medical providers at [facility] strive to bring you the best medical care.  In doing so we not only want to address your current medical conditions and concerns but also to detect new conditions early and prevent illness, disease and health-related problems.    Medicare offers a yearly Wellness Visit which allows our clinical staff to assess your need for preventative services including immunizations, lifestyle education, counseling to decrease risk of preventable diseases and screening for fall risk and other medical concerns.    This visit is provided free of charge (no copay) for all Medicare recipients. The clinical pharmacists at [facility] have begun to conduct these Wellness Visits which will also include a thorough review of all your medications.    As you primary medical provider recommend that you make an appointment for your Annual Wellness Visit if you have not done so already this year.  You may set up this appointment before you leave today or you may call back [phone] and schedule an appointment.  Please make sure when you call that you mention that you are scheduling your Annual Wellness Visit with the clinical pharmacist so that the appointment may be made for the proper length of time.

## 2014-04-16 NOTE — Progress Notes (Signed)
Subjective:    Patient ID: [name], male    DOB: 28-May-1941, 73 y.o.   MRN: [id number]  HPI Pt here for follow up and management of chronic medical problems. The patient has seen the cardiologist recently and everything was okay and stable. The patient does the cardiologist regularly and the nephrologist regularly. The patient has no specific complaints today. He is to get a chest x-ray, lab work, and will be given at [facility] to return. The patient is pleasant and alert and informed me that he and his wife will be moving to [facility] and will be there by Christmas this year.        Patient Active Problem List   Diagnosis Date Noted  . Coronary atherosclerosis of native coronary artery 09/04/2013  . Fracture of rib of left side 08/26/2013  . Rib pain 08/26/2013  . BPH (benign prostatic hyperplasia) 03/27/2013  . Allergic rhinitis 03/27/2013  . Infection involving implantable cardioverter-defibrillator 04/16/2012  . PVC's (premature ventricular contractions)   . MI, old   . Essential hypertension, benign   . Gastroesophageal reflux disease with hiatal hernia   . Osteoarthrosis and allied disorders   . Impotence   . Hemorrhoids   . Colon polyps   . Hyperlipidemia 05/03/2009  . GOUT, UNSPECIFIED 05/03/2009  . Benign essential HTN 05/03/2009  . CAD, ARTERY BYPASS GRAFT 05/03/2009  . Chronic systolic heart failure 97/67/3419  . CHRONIC KIDNEY DISEASE UNSPECIFIED 05/03/2009  . DEGENERATIVE JOINT DISEASE 05/03/2009  . ANEMIA, IRON DEFICIENCY, HX OF 05/03/2009   Outpatient Encounter Prescriptions as of 04/16/2014  Medication Sig  . allopurinol (ZYLOPRIM) 100 MG tablet TAKE 1 TABLET DAILY  . aspirin EC 325 MG EC tablet Take 325 mg by mouth at bedtime.   . carvedilol (COREG) 12.5 MG tablet TAKE  (1)  TABLET TWICE A DAY.  . cholecalciferol (VITAMIN D) 1000 UNITS tablet Take 1,000 Units by mouth daily.   . fluticasone (FLONASE) 50 MCG/ACT nasal spray Place 2 sprays  into the nose daily.  Marland Kitchen lisinopril (PRINIVIL,ZESTRIL) 10 MG tablet TAKE 1 TABLET DAILY  . multivitamin (THERAGRAN) per tablet Take 1 tablet by mouth daily.   . niacin (NIASPAN) 750 MG CR tablet TAKE 2 TABLETS AT BEDTIME  . omega-3 acid ethyl esters (LOVAZA) 1 G capsule TAKE 4 CAPSULE ONCE DAILY OR AS DIRECTED  . pravastatin (PRAVACHOL) 40 MG tablet TAKE  (1)  TABLET TWICE A DAY.  Marland Kitchen temazepam (RESTORIL) 30 MG capsule TAKE 1 CAPSULE AT BEDTIME  . traZODone (DESYREL) 50 MG tablet Take 1 tablet (50 mg total) by mouth at bedtime.    Review of Systems  Constitutional: Negative.   HENT: Negative.   Eyes: Negative.   Respiratory: Negative.   Cardiovascular: Negative.   Gastrointestinal: Negative.   Endocrine: Negative.   Genitourinary: Negative.   Musculoskeletal: Negative.   Skin: Negative.   Allergic/Immunologic: Negative.   Neurological: Negative.   Hematological: Negative.   Psychiatric/Behavioral: Negative.        Objective:   Physical Exam  Nursing note and vitals reviewed. Constitutional: He is oriented to person, place, and time. He appears well-developed and well-nourished. No distress.  Pleasant and alert.  HENT:  Head: Normocephalic and atraumatic.  Right Ear: External ear normal.  Left Ear: External ear normal.  Nose: Nose normal.  Mouth/Throat: Oropharynx is clear and moist. No oropharyngeal exudate.  Some nasal congestion bilaterally  Eyes: Conjunctivae and EOM are normal. Pupils are equal, round, and reactive to  light. Right eye exhibits no discharge. Left eye exhibits no discharge. No scleral icterus.  Neck: Normal range of motion. Neck supple. No thyromegaly present.  No carotid bruits  Cardiovascular: Normal rate, regular rhythm, normal heart sounds and intact distal pulses.  Exam reveals no gallop and no friction rub.   No murmur heard. At 60 per minute  Pulmonary/Chest: Effort normal and breath sounds normal. No respiratory distress. He has no wheezes. He has  no rales. He exhibits no tenderness.  No axillary nodes  Abdominal: Soft. Bowel sounds are normal. He exhibits no mass. There is no tenderness. There is no rebound and no guarding.  No inguinal nodes  Genitourinary: Rectum normal and penis normal.  The prostate is slightly enlarged without lumps or masses. There were no rectal masses. There were no inguinal hernias palpated. There were no inguinal nodes. The external genitalia were normal.  Musculoskeletal: Normal range of motion. He exhibits no edema and no tenderness.  Lymphadenopathy:    He has no cervical adenopathy.  Neurological: He is alert and oriented to person, place, and time. He has normal reflexes. No cranial nerve deficit.  Skin: Skin is warm and dry. No rash noted. No erythema. No pallor.  Psychiatric: He has a normal mood and affect. His behavior is normal. Judgment and thought content normal.   BP 131/89  Pulse 69  Temp(Src) 96.9 F (36.1 C) (Oral)  Ht 5\' 10"  (1.778 m)  Wt 190 lb (86.183 kg)  BMI 27.26 kg/m2  [facility] reading (PRIMARY) by  Dr.Aubriana Ravelo-chest x-ray--thoracic aorta are atherosclerosis, slightly enlarged heart, previous sternotomy                                       Assessment & Plan:  1. ANEMIA, IRON DEFICIENCY, HX OF - POCT CBC  2. Essential hypertension, benign - POCT CBC - BMP8+EGFR - Hepatic function panel - DG Chest 2 View; Future  3. Hyperlipidemia - POCT CBC - NMR, lipoprofile  4. Benign essential HTN - POCT CBC  5. BPH (benign prostatic hyperplasia) - POCT CBC - PSA, total and free  6. Vitamin D deficiency - Vit D  25 hydroxy (rtn osteoporosis monitoring)  7. Gout, unspecified cause, unspecified chronicity, unspecified site - Uric acid  8. Thoracic aorta atherosclerosis  9. Chronic kidney disease stage III  Meds ordered this encounter  Medications  . traZODone (DESYREL) 50 MG tablet    Sig: Take 1 tablet (50 mg total) by mouth at bedtime.    Dispense:  90 tablet     Refill:  3  . temazepam (RESTORIL) 30 MG capsule    Sig: TAKE 1 CAPSULE AT BEDTIME    Dispense:  90 capsule    Refill:  3  . pravastatin (PRAVACHOL) 40 MG tablet    Sig: TAKE  (1)  TABLET TWICE A DAY.    Dispense:  180 tablet    Refill:  3  . omega-3 acid ethyl esters (LOVAZA) 1 G capsule    Sig: TAKE 4 CAPSULE ONCE DAILY OR AS DIRECTED    Dispense:  360 capsule    Refill:  3  . niacin (NIASPAN) 750 MG CR tablet    Sig: TAKE 2 TABLETS AT BEDTIME    Dispense:  180 tablet    Refill:  3  . lisinopril (PRINIVIL,ZESTRIL) 10 MG tablet    Sig: TAKE 1 TABLET DAILY    Dispense:  90 tablet    Refill:  3  . fluticasone (FLONASE) 50 MCG/ACT nasal spray    Sig: Place 2 sprays into both nostrils daily.    Dispense:  48 g    Refill:  3  . carvedilol (COREG) 12.5 MG tablet    Sig: TAKE  (1)  TABLET TWICE A DAY.    Dispense:  180 tablet    Refill:  3  . allopurinol (ZYLOPRIM) 100 MG tablet    Sig: TAKE 1 TABLET DAILY    Dispense:  90 tablet    Refill:  3   Patient Instructions  Continue current medication Stay active physically and to not put yourself at risk for falling Continue regular visits with cardiology and nephrology We will call you with the results of the lab work as soon as those results are unavailable                        Medicare Annual Wellness Visit  [place] and the medical providers at [facility] strive to bring you the best medical care.  In doing so we not only want to address your current medical conditions and concerns but also to detect new conditions early and prevent illness, disease and health-related problems.    Medicare offers a yearly Wellness Visit which allows our clinical staff to assess your need for preventative services including immunizations, lifestyle education, counseling to decrease risk of preventable diseases and screening for fall risk and other medical concerns.    This visit is provided free of charge (no copay)  for all Medicare recipients. The clinical pharmacists at [facility] have begun to conduct these Wellness Visits which will also include a thorough review of all your medications.    As you primary medical provider recommend that you make an appointment for your Annual Wellness Visit if you have not done so already this year.  You may set up this appointment before you leave today or you may call back [phone]) and schedule an appointment.  Please make sure when you call that you mention that you are scheduling your Annual Wellness Visit with the clinical pharmacist so that the appointment may be made for the proper length of time.      Arrie Senate MD

## 2014-04-17 ENCOUNTER — Telehealth: Payer: Self-pay

## 2014-04-17 LAB — BMP8+EGFR
BUN / CREAT RATIO: 13 (ref 10–22)
BUN: 21 mg/dL (ref 8–27)
CHLORIDE: 100 mmol/L (ref 97–108)
CO2: 25 mmol/L (ref 18–29)
Calcium: 10.3 mg/dL — ABNORMAL HIGH (ref 8.6–10.2)
Creatinine, Ser: 1.56 mg/dL — ABNORMAL HIGH (ref 0.76–1.27)
GFR, EST AFRICAN AMERICAN: 50 mL/min/{1.73_m2} — AB (ref >59)
GFR, EST NON AFRICAN AMERICAN: 43 mL/min/{1.73_m2} — AB (ref >59)
Glucose: 101 mg/dL — ABNORMAL HIGH (ref 65–99)
Potassium: 4.6 mmol/L (ref 3.5–5.2)
Sodium: 141 mmol/L (ref 134–144)

## 2014-04-17 LAB — URINE CULTURE: Organism ID, Bacteria: NO GROWTH

## 2014-04-17 LAB — HEPATIC FUNCTION PANEL
ALBUMIN: 4.7 g/dL (ref 3.5–4.8)
ALK PHOS: 50 IU/L (ref 39–117)
ALT: 17 IU/L (ref 0–44)
AST: 27 IU/L (ref 0–40)
BILIRUBIN DIRECT: 0.11 mg/dL (ref 0.00–0.40)
Total Bilirubin: 0.4 mg/dL (ref 0.0–1.2)
Total Protein: 7.2 g/dL (ref 6.0–8.5)

## 2014-04-17 LAB — NMR, LIPOPROFILE
CHOLESTEROL: 153 mg/dL (ref 100–199)
HDL CHOLESTEROL BY NMR: 46 mg/dL (ref >39)
HDL Particle Number: 29.6 umol/L — ABNORMAL LOW (ref >=30.5)
LDL Particle Number: 1112 nmol/L — ABNORMAL HIGH (ref <1000)
LDL Size: 20.7 nm (ref >20.5)
LDLC SERPL CALC-MCNC: 88 mg/dL (ref 0–99)
LP-IR Score: 42 (ref <=45)
Small LDL Particle Number: 624 nmol/L — ABNORMAL HIGH (ref <=527)
TRIGLYCERIDES BY NMR: 95 mg/dL (ref 0–149)

## 2014-04-17 LAB — PSA, TOTAL AND FREE
PSA FREE PCT: 34.6 %
PSA, Free: 0.9 ng/mL
PSA: 2.6 ng/mL (ref 0.0–4.0)

## 2014-04-17 LAB — URIC ACID: Uric Acid: 6.2 mg/dL (ref 3.7–8.6)

## 2014-04-17 LAB — VITAMIN D 25 HYDROXY (VIT D DEFICIENCY, FRACTURES): Vit D, 25-Hydroxy: 35.2 ng/mL (ref 30.0–100.0)

## 2014-04-17 NOTE — Telephone Encounter (Signed)
[facility] to X-ray 

## 2014-04-20 NOTE — Telephone Encounter (Signed)
Message copied by Roselee CulverHUMLEY, Malita Ignasiak on Mon Apr 20, 2014  9:55 AM ------      Message from: Ernestina PennaMOORE, DONALD W      Created: Thu Apr 16, 2014 11:24 AM       As per radiology report ------

## 2014-04-20 NOTE — Telephone Encounter (Signed)
Pt aware of CXR results.

## 2014-04-21 ENCOUNTER — Telehealth: Payer: Self-pay | Admitting: Family Medicine

## 2014-04-21 NOTE — Telephone Encounter (Signed)
Message copied by Azalee CourseFULP, Marquise Wicke on Tue Apr 21, 2014 10:11 AM ------      Message from: Ernestina PennaMOORE, DONALD W      Created: Fri Apr 17, 2014 10:24 AM       The blood sugar slightly elevated at 101. The creatinine is slightly more elevated than it has been in the past at 1.56. The electrolytes including potassium are within normal limits except for serum calcium is slightly elevated. We will continue to monitor both the creatinine and calcium level. The patient should have a repeat BMP in 6 weeks.+++++      All liver function tests are within normal limit      And advanced lipid testing the total LDL particle number remains slightly elevated at 1112. The LDL C. is 88 and this is slightly elevated also. The triglycerides are good at 95. The HDL particle number, or the good cholesterol is low.----The patient should continue with his current medication and with as aggressive therapeutic lifestyle changes as possible which include diet and exercise      The vitamin D level is within normal limits, continue current treatment      The uric acid which is elevated with gout is good and within normal limits      The PSA is 2.6, one year ago it was 2.4 and this is a normal rate of rise      Please send a copy of this blood work to his nephrologist, Dr. Abel Prestoolodonato ------

## 2014-09-01 ENCOUNTER — Ambulatory Visit: Payer: BLUE CROSS/BLUE SHIELD | Admitting: Interventional Cardiology

## 2014-09-04 ENCOUNTER — Ambulatory Visit: Payer: BLUE CROSS/BLUE SHIELD | Admitting: Interventional Cardiology

## 2014-09-07 ENCOUNTER — Ambulatory Visit: Payer: Medicare Other | Admitting: Family Medicine

## 2016-02-22 ENCOUNTER — Other Ambulatory Visit: Payer: Self-pay

## 2023-11-25 DEATH — deceased
